# Patient Record
Sex: Male | Born: 1985 | Hispanic: No | Marital: Married | Smoking: Former smoker
Health system: Southern US, Community
[De-identification: ages and names within clinical notes are randomized; demographics above are authoritative.]

## PROBLEM LIST (undated history)

## (undated) DIAGNOSIS — Z720 Tobacco use: Secondary | ICD-10-CM

## (undated) DIAGNOSIS — F988 Other specified behavioral and emotional disorders with onset usually occurring in childhood and adolescence: Secondary | ICD-10-CM

## (undated) DIAGNOSIS — E785 Hyperlipidemia, unspecified: Secondary | ICD-10-CM

## (undated) DIAGNOSIS — G4733 Obstructive sleep apnea (adult) (pediatric): Secondary | ICD-10-CM

## (undated) HISTORY — DX: Obstructive sleep apnea (adult) (pediatric): G47.33

## (undated) HISTORY — DX: Tobacco use: Z72.0

## (undated) HISTORY — DX: Hyperlipidemia, unspecified: E78.5

## (undated) HISTORY — PX: NO PAST SURGERIES: SHX2092

## (undated) HISTORY — DX: Other specified behavioral and emotional disorders with onset usually occurring in childhood and adolescence: F98.8

---

## 2010-08-10 ENCOUNTER — Encounter: Payer: Self-pay | Admitting: *Deleted

## 2010-08-10 ENCOUNTER — Emergency Department (HOSPITAL_BASED_OUTPATIENT_CLINIC_OR_DEPARTMENT_OTHER)
Admission: EM | Admit: 2010-08-10 | Discharge: 2010-08-10 | Disposition: A | Discharge status: Home / Self Care | Payer: BC Managed Care – PPO | Attending: Emergency Medicine | Admitting: Emergency Medicine

## 2010-08-10 DIAGNOSIS — T2026XA Burn of second degree of forehead and cheek, initial encounter: Secondary | ICD-10-CM | POA: Insufficient documentation

## 2010-08-10 DIAGNOSIS — T2024XA Burn of second degree of nose (septum), initial encounter: Secondary | ICD-10-CM | POA: Insufficient documentation

## 2010-08-10 DIAGNOSIS — X020XXA Exposure to flames in controlled fire in building or structure, initial encounter: Secondary | ICD-10-CM | POA: Insufficient documentation

## 2010-08-10 DIAGNOSIS — F17200 Nicotine dependence, unspecified, uncomplicated: Secondary | ICD-10-CM | POA: Insufficient documentation

## 2010-08-10 DIAGNOSIS — T2020XA Burn of second degree of head, face, and neck, unspecified site, initial encounter: Secondary | ICD-10-CM

## 2010-08-10 MED ORDER — HYDROMORPHONE HCL 1 MG/ML IJ SOLN
INTRAMUSCULAR | Status: AC
  Administered 2010-08-10: 1 mg
  Filled 2010-08-10: qty 1

## 2010-08-10 MED ORDER — FLUORESCEIN SODIUM 1 MG OP STRP
ORAL_STRIP | OPHTHALMIC | Status: AC
  Administered 2010-08-10: 2
  Filled 2010-08-10: qty 2

## 2010-08-10 MED ORDER — PROPARACAINE HCL 0.5 % OP SOLN
OPHTHALMIC | Status: AC
  Administered 2010-08-10: 2 [drp]
  Filled 2010-08-10: qty 15

## 2010-08-10 MED ORDER — TETANUS-DIPHTH-ACELL PERTUSSIS 5-2-15.5 LF-MCG/0.5 IM SUSP
0.5000 mL | Freq: Once | INTRAMUSCULAR | Status: AC
  Administered 2010-08-10: 0.5 mL via INTRAMUSCULAR
  Filled 2010-08-10: qty 0.5

## 2010-08-10 MED ORDER — OXYCODONE-ACETAMINOPHEN 5-325 MG PO TABS: 1.0000 | ORAL_TABLET | ORAL | Status: AC | PRN
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
## 2010-10-09 ENCOUNTER — Ambulatory Visit (INDEPENDENT_AMBULATORY_CARE_PROVIDER_SITE_OTHER): Payer: BC Managed Care – PPO | Admitting: Internal Medicine

## 2010-10-09 ENCOUNTER — Encounter: Payer: Self-pay | Admitting: Internal Medicine

## 2010-10-09 DIAGNOSIS — R030 Elevated blood-pressure reading, without diagnosis of hypertension: Secondary | ICD-10-CM

## 2010-10-09 DIAGNOSIS — F988 Other specified behavioral and emotional disorders with onset usually occurring in childhood and adolescence: Secondary | ICD-10-CM

## 2010-10-09 DIAGNOSIS — F17200 Nicotine dependence, unspecified, uncomplicated: Secondary | ICD-10-CM

## 2010-10-09 DIAGNOSIS — E785 Hyperlipidemia, unspecified: Secondary | ICD-10-CM

## 2010-10-09 DIAGNOSIS — IMO0001 Reserved for inherently not codable concepts without codable children: Secondary | ICD-10-CM

## 2010-10-09 MED ORDER — AMPHETAMINE-DEXTROAMPHETAMINE 20 MG PO TABS: 20.0000 mg | ORAL_TABLET | Freq: Two times a day (BID) | ORAL | Status: DC

## 2010-10-09 NOTE — Patient Instructions (Signed)
Please schedule chem7 (v58.69), lipid/lft (272.4) prior to next visit

## 2010-10-10 DIAGNOSIS — F17200 Nicotine dependence, unspecified, uncomplicated: Secondary | ICD-10-CM | POA: Insufficient documentation

## 2010-10-10 DIAGNOSIS — IMO0001 Reserved for inherently not codable concepts without codable children: Secondary | ICD-10-CM | POA: Insufficient documentation

## 2010-10-10 DIAGNOSIS — F988 Other specified behavioral and emotional disorders with onset usually occurring in childhood and adolescence: Secondary | ICD-10-CM | POA: Insufficient documentation

## 2010-10-10 DIAGNOSIS — E785 Hyperlipidemia, unspecified: Secondary | ICD-10-CM | POA: Insufficient documentation

## 2010-10-10 NOTE — Assessment & Plan Note (Signed)
Stable. Rf adderall at current dosing. Monitor bp.

## 2010-10-10 NOTE — Assessment & Plan Note (Signed)
Actively attempting cessation. Performing wean. Not currently interested in medication. Discussed tobacco cessation including potential medications. Total time of discussion approximately 3 minutes.

## 2010-10-10 NOTE — Progress Notes (Signed)
  Subjective:    Patient ID: Joel [name], male    DOB: 1985-02-06, 25 y.o.   MRN: [id number]  HPI Pt presents to clinic to establish care and for evaluation of multiple medical problems. H/o ADD s/p formal evaluation years ago. Takes adderral with increased concentration. Currently working at multiple jobs and going to school. Finds the medication beneficial. Has experienced no side effects of nervousness, wt change or insomnia. BP is mildly elevated. Home BP typically 130's/80's. No formal h/o HTN. H/o hyperlipidemia previously taking medication but now off. Did not have side effects. Receives influenza vaccine at work. Smokes 1/2 tobacco daily. Is attempting weaning.  No other complaints.   Reviewed pmh, psh, medications, allergies, soc hx and fam hx.    Review of Systems  Constitutional: Negative for appetite change and unexpected weight change.  Respiratory: Negative for shortness of breath.   Cardiovascular: Negative for chest pain.  Psychiatric/Behavioral: Negative for sleep disturbance and agitation. The patient is not nervous/anxious.   All other systems reviewed and are negative.       Objective:   Physical Exam  Physical Exam  Nursing note and vitals reviewed. Constitutional: Appears well-developed and well-nourished. No distress.  HENT: PERRL, EOM grossly intact. OP clear without erythema or exudate. Uvula midline. Mucous membranes moist Head: Normocephalic and atraumatic.  Right Ear: External ear normal.  Left Ear: External ear normal.  Eyes: Conjunctivae are normal. No scleral icterus.  Neck: Neck supple. Carotid bruit is not present.  Cardiovascular: Normal rate, regular rhythm and normal heart sounds.  Exam reveals no gallop and no friction rub.   No murmur heard. Pulmonary/Chest: Effort normal and breath sounds normal. No respiratory distress. He has no wheezes. no rales.  Lymphadenopathy:    He has no cervical adenopathy.  Neurological:Alert.  Skin: Skin is  warm and dry. Not diaphoretic.  Psychiatric: Has a normal mood and affect.        Assessment & Plan:

## 2010-10-10 NOTE — Assessment & Plan Note (Signed)
No formal h/o htn. Recommend close observation with home bp log. If elevations persist recommend dc of adderral.

## 2010-10-10 NOTE — Assessment & Plan Note (Signed)
Off medication. Low fat diet and exercise. Obtain lipid/lft prior to next visit

## 2010-11-07 ENCOUNTER — Telehealth: Payer: Self-pay | Admitting: Internal Medicine

## 2010-11-07 MED ORDER — AMPHETAMINE-DEXTROAMPHETAMINE 20 MG PO TABS: 20.0000 mg | ORAL_TABLET | Freq: Two times a day (BID) | ORAL | Status: DC

## 2010-11-07 NOTE — Telephone Encounter (Signed)
Pt called needs a refill on his  Adderall 20mg    Please call when ready

## 2010-11-07 NOTE — Telephone Encounter (Signed)
Yes pls

## 2010-11-07 NOTE — Telephone Encounter (Signed)
Is it okay to refill for patient. He is scheduled for follow up appointment on 01/01/2011.

## 2010-11-07 NOTE — Telephone Encounter (Signed)
Call placed to patient at [phone], no answer. A detailed voice message was left informing patient Rx available for pick up.

## 2011-01-01 ENCOUNTER — Ambulatory Visit: Payer: BC Managed Care – PPO | Admitting: Internal Medicine

## 2011-01-01 ENCOUNTER — Ambulatory Visit: Payer: BC Managed Care – PPO | Admitting: Family

## 2011-01-01 DIAGNOSIS — Z0289 Encounter for other administrative examinations: Secondary | ICD-10-CM

## 2011-01-10 ENCOUNTER — Telehealth: Payer: Self-pay | Admitting: Internal Medicine

## 2011-01-10 DIAGNOSIS — F988 Other specified behavioral and emotional disorders with onset usually occurring in childhood and adolescence: Secondary | ICD-10-CM

## 2011-01-10 DIAGNOSIS — E785 Hyperlipidemia, unspecified: Secondary | ICD-10-CM

## 2011-01-10 NOTE — Telephone Encounter (Signed)
Patient states that he is coming in next week for a follow up and is unsure if he needs labs. He states that if he does have to do lab work he will go in tomorrow morning to [facility].   Also, he is out of adderrall and would like a new prescription for that.

## 2011-01-10 NOTE — Telephone Encounter (Signed)
Ok to print two prescription rf. Lipid/lft 272.4 chem7 v58.69

## 2011-01-10 NOTE — Telephone Encounter (Signed)
Dr Rodena Medin; I did not find any lab notes. Will the patient need blood work before his follow up appointment with you on 01/18/2011.

## 2011-01-11 MED ORDER — AMPHETAMINE-DEXTROAMPHETAMINE 20 MG PO TABS: 20.0000 mg | ORAL_TABLET | Freq: Two times a day (BID) | ORAL | Status: DC

## 2011-01-11 NOTE — Telephone Encounter (Signed)
Call placed to patient at [phone], he was informed of blood work and stated he will come on Friday 01/12/2011 for the blood work.  Rx's Printed signed and left at front desk for patient pick up.

## 2011-01-12 LAB — LIPID PANEL
Cholesterol: 200 mg/dL (ref 0–200)
HDL: 41 mg/dL (ref >39)
LDL Cholesterol: 127 mg/dL — ABNORMAL HIGH (ref 0–99)
Total CHOL/HDL Ratio: 4.9 Ratio
Triglycerides: 159 mg/dL — ABNORMAL HIGH (ref <150)
VLDL: 32 mg/dL (ref 0–40)

## 2011-01-12 LAB — HEPATIC FUNCTION PANEL
ALT: 104 U/L — ABNORMAL HIGH (ref 0–53)
AST: 41 U/L — ABNORMAL HIGH (ref 0–37)
Albumin: 4.6 g/dL (ref 3.5–5.2)
Alkaline Phosphatase: 82 U/L (ref 39–117)
Bilirubin, Direct: 0.2 mg/dL (ref 0.0–0.3)
Indirect Bilirubin: 0.6 mg/dL (ref 0.0–0.9)
Total Bilirubin: 0.8 mg/dL (ref 0.3–1.2)
Total Protein: 7 g/dL (ref 6.0–8.3)

## 2011-01-15 ENCOUNTER — Ambulatory Visit (HOSPITAL_BASED_OUTPATIENT_CLINIC_OR_DEPARTMENT_OTHER)
Admission: RE | Admit: 2011-01-15 | Discharge: 2011-01-15 | Disposition: A | Discharge status: Home / Self Care | Payer: BC Managed Care – PPO | Source: Ambulatory Visit | Attending: Internal Medicine | Admitting: Internal Medicine

## 2011-01-15 ENCOUNTER — Ambulatory Visit (INDEPENDENT_AMBULATORY_CARE_PROVIDER_SITE_OTHER): Payer: BC Managed Care – PPO | Admitting: Internal Medicine

## 2011-01-15 ENCOUNTER — Encounter: Payer: Self-pay | Admitting: Internal Medicine

## 2011-01-15 VITALS — BP 106/74 | HR 90 | Temp 98.1°F | Resp 18 | Wt 227.0 lb

## 2011-01-15 DIAGNOSIS — M25519 Pain in unspecified shoulder: Secondary | ICD-10-CM

## 2011-01-15 DIAGNOSIS — M25511 Pain in right shoulder: Secondary | ICD-10-CM

## 2011-01-15 MED ORDER — HYDROCODONE-ACETAMINOPHEN 5-500 MG PO TABS: 1.0000 | ORAL_TABLET | Freq: Four times a day (QID) | ORAL | Status: AC | PRN

## 2011-01-17 DIAGNOSIS — M25519 Pain in unspecified shoulder: Secondary | ICD-10-CM | POA: Insufficient documentation

## 2011-01-17 NOTE — Progress Notes (Signed)
  Subjective:    Patient ID: Joel [name], male    DOB: [birth year], 25 y.o.   MRN: [id number]  HPI Pt presents to clinic for evaluation of shoulder pain. Yesterday while pulling himself up and down into an attic noted a pop of left shoulder and intense pain. Pushed the upper arm back in however has had decreased ROM especially abduction. Describes pain level 7/10. Pain worse with movement. Attempted sling. Not taking any medication for the problem. No other complaints.  No past medical history on file. Past Surgical History  Procedure Date  . No past surgeries     denies surgical history    reports that he has been smoking.  He has never used smokeless tobacco. He reports that he uses illicit drugs (Marijuana) about 7 times per week. He reports that he does not drink alcohol. family history includes Healthy in his brother and father and Hypertension in his mother. Allergies  Allergen Reactions  . Penicillins     Mother allergic to it. Pt never taken it.     Review of Systems see hpi     Objective:   Physical Exam  Nursing note and vitals reviewed. Constitutional: He appears well-developed and well-nourished. No distress.  HENT:  Head: Normocephalic and atraumatic.  Musculoskeletal:       Left shoulder -NT. ROM limited by pain. Abduction significantly limited  Neurological: He is alert.  Skin: Skin is warm and dry. He is not diaphoretic.  Psychiatric: He has a normal mood and affect.          Assessment & Plan:

## 2011-01-17 NOTE — Assessment & Plan Note (Signed)
Shoulder xray today. Work note provided. Attempt short term vicodin prn. Cautioned re possible sedating effect. Anticipate possible ortho consult pending xray results

## 2011-01-18 ENCOUNTER — Ambulatory Visit: Payer: BC Managed Care – PPO | Admitting: Internal Medicine

## 2011-01-18 DIAGNOSIS — Z0289 Encounter for other administrative examinations: Secondary | ICD-10-CM

## 2011-02-09 ENCOUNTER — Encounter: Payer: Self-pay | Admitting: *Deleted

## 2011-03-08 ENCOUNTER — Other Ambulatory Visit: Payer: Self-pay | Admitting: *Deleted

## 2011-03-08 DIAGNOSIS — F988 Other specified behavioral and emotional disorders with onset usually occurring in childhood and adolescence: Secondary | ICD-10-CM

## 2011-03-08 MED ORDER — AMPHETAMINE-DEXTROAMPHETAMINE 20 MG PO TABS: 20.0000 mg | ORAL_TABLET | Freq: Two times a day (BID) | ORAL | Status: DC

## 2011-03-08 NOTE — Telephone Encounter (Signed)
Received call from pt requesting refill of adderall. Rx printed and forwarded to Provider for signature. Advised pt I will call him tomorrow when rx is ready for pick up.

## 2011-03-09 NOTE — Telephone Encounter (Signed)
Rx was forwarded to Receptionist to call pt for pick up at 11:30am today.

## 2011-04-11 ENCOUNTER — Telehealth: Payer: Self-pay | Admitting: Internal Medicine

## 2011-04-11 DIAGNOSIS — R945 Abnormal results of liver function studies: Secondary | ICD-10-CM

## 2011-04-11 DIAGNOSIS — R7989 Other specified abnormal findings of blood chemistry: Secondary | ICD-10-CM

## 2011-04-11 DIAGNOSIS — F988 Other specified behavioral and emotional disorders with onset usually occurring in childhood and adolescence: Secondary | ICD-10-CM

## 2011-04-11 MED ORDER — AMPHETAMINE-DEXTROAMPHETAMINE 20 MG PO TABS: 20.0000 mg | ORAL_TABLET | Freq: Two times a day (BID) | ORAL | Status: DC

## 2011-04-11 NOTE — Telephone Encounter (Signed)
NEEDS RX FOR ADDERALL 20 MG

## 2011-04-11 NOTE — Telephone Encounter (Signed)
Call returned to patient at [phone], no answer. A detailed voice message was left informing patient per Dr Rodena Medin instructions. He was advised to call to schedule follow up appointment with blood work.  Rx printed and sent to provider for signature.

## 2011-04-11 NOTE — Telephone Encounter (Signed)
Ok to print but needs f/u appt and labs. lft elevated in December. Multiple calls made and letter sent without response.

## 2011-04-12 NOTE — Telephone Encounter (Signed)
Patient presented to office, Rx for Adderall provided to patient. He was advised to schedule follow up. He has scheduled for 05/10/2011 with Dr Rodena Medin. Lab orders entered for April 2013.

## 2011-05-10 ENCOUNTER — Ambulatory Visit (INDEPENDENT_AMBULATORY_CARE_PROVIDER_SITE_OTHER): Payer: BC Managed Care – PPO | Admitting: Internal Medicine

## 2011-05-10 ENCOUNTER — Encounter: Payer: Self-pay | Admitting: Internal Medicine

## 2011-05-10 ENCOUNTER — Telehealth: Payer: Self-pay | Admitting: Internal Medicine

## 2011-05-10 VITALS — BP 114/82 | HR 92 | Temp 98.1°F | Resp 18 | Wt 233.0 lb

## 2011-05-10 DIAGNOSIS — F988 Other specified behavioral and emotional disorders with onset usually occurring in childhood and adolescence: Secondary | ICD-10-CM

## 2011-05-10 DIAGNOSIS — E785 Hyperlipidemia, unspecified: Secondary | ICD-10-CM

## 2011-05-10 DIAGNOSIS — R7989 Other specified abnormal findings of blood chemistry: Secondary | ICD-10-CM

## 2011-05-10 DIAGNOSIS — M25519 Pain in unspecified shoulder: Secondary | ICD-10-CM

## 2011-05-10 DIAGNOSIS — R945 Abnormal results of liver function studies: Secondary | ICD-10-CM | POA: Insufficient documentation

## 2011-05-10 DIAGNOSIS — Z79899 Other long term (current) drug therapy: Secondary | ICD-10-CM

## 2011-05-10 LAB — HEPATIC FUNCTION PANEL
ALT: 113 U/L — ABNORMAL HIGH (ref 0–53)
AST: 41 U/L — ABNORMAL HIGH (ref 0–37)
Albumin: 4.5 g/dL (ref 3.5–5.2)
Alkaline Phosphatase: 85 U/L (ref 39–117)
Bilirubin, Direct: 0.1 mg/dL (ref 0.0–0.3)
Indirect Bilirubin: 0.3 mg/dL (ref 0.0–0.9)
Total Bilirubin: 0.4 mg/dL (ref 0.3–1.2)
Total Protein: 6.9 g/dL (ref 6.0–8.3)

## 2011-05-10 LAB — IRON: Iron: 73 ug/dL (ref 42–165)

## 2011-05-10 MED ORDER — AMPHETAMINE-DEXTROAMPHETAMINE 20 MG PO TABS: 20.0000 mg | ORAL_TABLET | Freq: Two times a day (BID) | ORAL | Status: DC

## 2011-05-10 NOTE — Patient Instructions (Signed)
Please schedule fasting labs prior to next visit chem7-v58.69, lipid/lft 272.4 

## 2011-05-10 NOTE — Progress Notes (Signed)
  Subjective:    Patient ID: Joel [name], male    DOB: 1986/01/23, 26 y.o.   MRN: [id number]  HPI Pt presents to clinic for followup of multiple medical problems. Last visit had significant shoulder pain s/p injury and was referred to orthopedics. States pain resolved without surgical intervention. Has FROM. Reviewed previously lft with elevated ast/alt. Notes no past h/o abn lft. Denies regular tylenol or etoh use. No regular otc/herbal medication. Denies abd pain, scleral icterus, n/v.  No past medical history on file. Past Surgical History  Procedure Date  . No past surgeries     denies surgical history    reports that he has been smoking.  He has never used smokeless tobacco. He reports that he uses illicit drugs (Marijuana) about 7 times per week. He reports that he does not drink alcohol. family history includes Healthy in his brother and father and Hypertension in his mother. Allergies  Allergen Reactions  . Penicillins     Mother allergic to it. Pt never taken it.      Review of Systems see hpi     Objective:   Physical Exam  Nursing note and vitals reviewed. Constitutional: He appears well-developed and well-nourished. No distress.  HENT:  Head: Normocephalic and atraumatic.  Eyes: Conjunctivae are normal. No scleral icterus.  Neurological: He is alert.  Skin: He is not diaphoretic.  Psychiatric: He has a normal mood and affect.          Assessment & Plan:

## 2011-05-10 NOTE — Assessment & Plan Note (Signed)
Stable. rf medication

## 2011-05-10 NOTE — Telephone Encounter (Signed)
Please schedule fasting labs prior to next visit  chem7-v58.69, lipid/lft-272.4   Patient has upcoming appointment on 08/10/11. Patient will be going to [facility] lab.

## 2011-05-10 NOTE — Assessment & Plan Note (Signed)
resolved 

## 2011-05-10 NOTE — Assessment & Plan Note (Signed)
Repeat lft. Obtain hepatitis panel and ferritin level. Consider hepatic imaging if lft's remain elevated.

## 2011-05-11 LAB — HEPATITIS PANEL, ACUTE
HCV Ab: NEGATIVE
Hep A IgM: NEGATIVE
Hep B C IgM: NEGATIVE
Hepatitis B Surface Ag: NEGATIVE

## 2011-05-11 NOTE — Telephone Encounter (Signed)
Lab orders entered for July 2013. 

## 2011-06-11 ENCOUNTER — Telehealth: Payer: Self-pay | Admitting: Internal Medicine

## 2011-06-11 DIAGNOSIS — F988 Other specified behavioral and emotional disorders with onset usually occurring in childhood and adolescence: Secondary | ICD-10-CM

## 2011-06-12 MED ORDER — AMPHETAMINE-DEXTROAMPHETAMINE 20 MG PO TABS: 20.0000 mg | ORAL_TABLET | Freq: Two times a day (BID) | ORAL | Status: DC

## 2011-06-12 NOTE — Telephone Encounter (Signed)
Call placed to patient at [phone], he was informed per Dr Rodena Medin approval, Rx re-printed and left at front desk for patient pick up.

## 2011-06-13 NOTE — Telephone Encounter (Signed)
Per Marj at front desk, patient has picked up Rx today at lunch time.

## 2011-07-12 ENCOUNTER — Telehealth: Payer: Self-pay | Admitting: Internal Medicine

## 2011-07-12 DIAGNOSIS — F988 Other specified behavioral and emotional disorders with onset usually occurring in childhood and adolescence: Secondary | ICD-10-CM

## 2011-07-12 NOTE — Telephone Encounter (Signed)
Ok. But never did abd [place] for abn lft

## 2011-07-12 NOTE — Telephone Encounter (Signed)
Patient is requesting a refill of adderall.

## 2011-07-13 ENCOUNTER — Telehealth: Payer: Self-pay | Admitting: Internal Medicine

## 2011-07-13 MED ORDER — AMPHETAMINE-DEXTROAMPHETAMINE 20 MG PO TABS: 20.0000 mg | ORAL_TABLET | Freq: Two times a day (BID) | ORAL | Status: DC

## 2011-07-13 NOTE — Telephone Encounter (Signed)
Patient picked up adderall prescription.

## 2011-07-13 NOTE — Telephone Encounter (Signed)
Rx printed and forwarded to provider for signature.   

## 2011-07-13 NOTE — Telephone Encounter (Signed)
noted 

## 2011-07-13 NOTE — Telephone Encounter (Signed)
Call placed to patient at [phone], he was informed Rx would be left at front desk for pick up.

## 2011-07-13 NOTE — Telephone Encounter (Signed)
Noted patient pick up in 07/13/2011 phone note.

## 2011-08-09 ENCOUNTER — Telehealth: Payer: Self-pay | Admitting: Internal Medicine

## 2011-08-09 DIAGNOSIS — F988 Other specified behavioral and emotional disorders with onset usually occurring in childhood and adolescence: Secondary | ICD-10-CM

## 2011-08-09 MED ORDER — AMPHETAMINE-DEXTROAMPHETAMINE 20 MG PO TABS: 20.0000 mg | ORAL_TABLET | Freq: Two times a day (BID) | ORAL | Status: DC

## 2011-08-09 NOTE — Telephone Encounter (Signed)
Patient had a 3 month follow up visit with Dr. Rodena Medin tomorrow afternoon but he forgot to do labs prior. He rescheduled for 08/21/11 and will go do labs Monday or Tuesday of next week.   He would like a new prescription of adderall.

## 2011-08-09 NOTE — Telephone Encounter (Signed)
ok 

## 2011-08-09 NOTE — Telephone Encounter (Signed)
Last rx printed fo4 07/13/11. Pt has f/u on 08/21/11. Rx printed and forwarded to Provider for signature.

## 2011-08-10 ENCOUNTER — Ambulatory Visit: Payer: BC Managed Care – PPO | Admitting: Internal Medicine

## 2011-08-10 ENCOUNTER — Telehealth: Payer: Self-pay | Admitting: *Deleted

## 2011-08-10 NOTE — Telephone Encounter (Signed)
See phone note of 08/10/11 re: message.

## 2011-08-10 NOTE — Telephone Encounter (Signed)
Best # to reach patient at is [phone]. To let him know that rx is ready to be picked up.

## 2011-08-10 NOTE — Telephone Encounter (Signed)
Left message on VM of Rx ready to pick up at front desk

## 2011-08-16 LAB — BASIC METABOLIC PANEL
BUN: 10 mg/dL (ref 6–23)
CO2: 25 mEq/L (ref 19–32)
Calcium: 8.9 mg/dL (ref 8.4–10.5)
Chloride: 108 mEq/L (ref 96–112)
Creat: 0.92 mg/dL (ref 0.50–1.35)
Glucose, Bld: 89 mg/dL (ref 70–99)
Potassium: 4.3 mEq/L (ref 3.5–5.3)
Sodium: 138 mEq/L (ref 135–145)

## 2011-08-16 NOTE — Addendum Note (Signed)
Addended by: Mervin Kung A on: 08/16/2011 09:15 AM   Modules accepted: Orders

## 2011-08-16 NOTE — Telephone Encounter (Signed)
Pt presented to the lab, orders released. 

## 2011-08-17 LAB — HEPATIC FUNCTION PANEL
ALT: 98 U/L — ABNORMAL HIGH (ref 0–53)
AST: 40 U/L — ABNORMAL HIGH (ref 0–37)
Albumin: 4.1 g/dL (ref 3.5–5.2)
Alkaline Phosphatase: 83 U/L (ref 39–117)
Bilirubin, Direct: 0.1 mg/dL (ref 0.0–0.3)
Indirect Bilirubin: 0.3 mg/dL (ref 0.0–0.9)
Total Bilirubin: 0.4 mg/dL (ref 0.3–1.2)
Total Protein: 6.2 g/dL (ref 6.0–8.3)

## 2011-08-17 LAB — LIPID PANEL
Cholesterol: 166 mg/dL (ref 0–200)
HDL: 37 mg/dL — ABNORMAL LOW (ref >39)
LDL Cholesterol: 92 mg/dL (ref 0–99)
Total CHOL/HDL Ratio: 4.5 Ratio
Triglycerides: 185 mg/dL — ABNORMAL HIGH (ref <150)
VLDL: 37 mg/dL (ref 0–40)

## 2011-08-21 ENCOUNTER — Encounter: Payer: Self-pay | Admitting: Internal Medicine

## 2011-08-21 ENCOUNTER — Ambulatory Visit (INDEPENDENT_AMBULATORY_CARE_PROVIDER_SITE_OTHER): Payer: BC Managed Care – PPO | Admitting: Internal Medicine

## 2011-08-21 VITALS — BP 118/78 | HR 93 | Temp 98.3°F | Resp 16 | Wt 230.8 lb

## 2011-08-21 DIAGNOSIS — R7989 Other specified abnormal findings of blood chemistry: Secondary | ICD-10-CM

## 2011-08-21 DIAGNOSIS — R945 Abnormal results of liver function studies: Secondary | ICD-10-CM

## 2011-08-21 DIAGNOSIS — F988 Other specified behavioral and emotional disorders with onset usually occurring in childhood and adolescence: Secondary | ICD-10-CM

## 2011-08-21 DIAGNOSIS — R748 Abnormal levels of other serum enzymes: Secondary | ICD-10-CM

## 2011-08-21 NOTE — Patient Instructions (Signed)
Please schedule your ultrasound for a morning this week if possible.

## 2011-08-23 ENCOUNTER — Ambulatory Visit (HOSPITAL_BASED_OUTPATIENT_CLINIC_OR_DEPARTMENT_OTHER)
Admission: RE | Admit: 2011-08-23 | Discharge: 2011-08-23 | Disposition: A | Discharge status: Home / Self Care | Payer: BC Managed Care – PPO | Source: Ambulatory Visit | Attending: Internal Medicine | Admitting: Internal Medicine

## 2011-08-23 DIAGNOSIS — R748 Abnormal levels of other serum enzymes: Secondary | ICD-10-CM | POA: Insufficient documentation

## 2011-08-23 DIAGNOSIS — K7689 Other specified diseases of liver: Secondary | ICD-10-CM | POA: Insufficient documentation

## 2011-09-02 NOTE — Assessment & Plan Note (Signed)
Schedule abdominal US for further evaluation.

## 2011-09-02 NOTE — Assessment & Plan Note (Signed)
Stable; continue current regimen.

## 2011-09-02 NOTE — Progress Notes (Signed)
  Subjective:    Patient ID: Joel [name], male    DOB: 1985-12-17, 26 y.o.   MRN: [id number]  HPI Pt presents to clinic for followup of multiple medical problems.  Reviewed elevated lft with neg hep panel and nl iron level. Recent lft mildly improved. Denies etoh or tylenol use.ADD stable with no side effects from medication.   No past medical history on file. Past Surgical History  Procedure Date  . No past surgeries     denies surgical history    reports that he has been smoking.  He has never used smokeless tobacco. He reports that he uses illicit drugs (Marijuana) about 7 times per week. He reports that he does not drink alcohol. family history includes Healthy in his brother and father and Hypertension in his mother. Allergies  Allergen Reactions  . Penicillins     Mother allergic to it. Pt never taken it.      Review of Systems  Gastrointestinal: Negative for nausea and abdominal pain.   see hpi     Objective:   Physical Exam  Constitutional: He appears well-developed and well-nourished. No distress.  Eyes: Conjunctivae are normal. No scleral icterus.  Neck: Neck supple.  Neurological: He is alert.  Skin: He is not diaphoretic.  Psychiatric: He has a normal mood and affect.          Assessment & Plan:

## 2011-09-10 ENCOUNTER — Other Ambulatory Visit: Payer: Self-pay | Admitting: Internal Medicine

## 2011-09-10 ENCOUNTER — Telehealth: Payer: Self-pay | Admitting: Internal Medicine

## 2011-09-10 DIAGNOSIS — F988 Other specified behavioral and emotional disorders with onset usually occurring in childhood and adolescence: Secondary | ICD-10-CM

## 2011-09-10 MED ORDER — AMPHETAMINE-DEXTROAMPHETAMINE 20 MG PO TABS: 20.0000 mg | ORAL_TABLET | Freq: Two times a day (BID) | ORAL | Status: DC

## 2011-09-10 NOTE — Telephone Encounter (Signed)
printed

## 2011-09-10 NOTE — Telephone Encounter (Signed)
Patient need his prescription written for Adderall 20mg    Would like to pick up tomorrow

## 2011-09-10 NOTE — Telephone Encounter (Signed)
Informed patient Rx can be p/u tomorrow 08.13.13 between the hours of 8am-5pm [Mon-Fri]/SLS

## 2011-10-12 ENCOUNTER — Other Ambulatory Visit: Payer: Self-pay | Admitting: *Deleted

## 2011-10-12 DIAGNOSIS — F988 Other specified behavioral and emotional disorders with onset usually occurring in childhood and adolescence: Secondary | ICD-10-CM

## 2011-10-12 MED ORDER — AMPHETAMINE-DEXTROAMPHETAMINE 20 MG PO TABS: 20.0000 mg | ORAL_TABLET | Freq: Two times a day (BID) | ORAL | Status: DC

## 2011-10-12 NOTE — Telephone Encounter (Signed)
Ok to print

## 2011-10-12 NOTE — Telephone Encounter (Signed)
Adderall request [Last Rx 08.12.13 #[id number]]/SLS Please advise.

## 2011-10-15 NOTE — Telephone Encounter (Signed)
LMOM with contact name & number to Inform patient Rx requested is ready forp/u Mon-Fri 8a-5p

## 2011-10-29 ENCOUNTER — Ambulatory Visit (INDEPENDENT_AMBULATORY_CARE_PROVIDER_SITE_OTHER): Payer: BC Managed Care – PPO | Admitting: Family

## 2011-10-29 ENCOUNTER — Encounter: Payer: Self-pay | Admitting: Family

## 2011-10-29 VITALS — BP 118/86 | HR 84 | Temp 98.4°F | Resp 16 | Wt 231.1 lb

## 2011-10-29 DIAGNOSIS — M79609 Pain in unspecified limb: Secondary | ICD-10-CM

## 2011-10-29 DIAGNOSIS — S99922A Unspecified injury of left foot, initial encounter: Secondary | ICD-10-CM

## 2011-10-29 DIAGNOSIS — S8990XA Unspecified injury of unspecified lower leg, initial encounter: Secondary | ICD-10-CM

## 2011-10-29 DIAGNOSIS — M79672 Pain in left foot: Secondary | ICD-10-CM

## 2011-10-29 MED ORDER — MELOXICAM 15 MG PO TABS: 15.0000 mg | ORAL_TABLET | Freq: Every day | ORAL | Status: DC

## 2011-10-29 NOTE — Progress Notes (Signed)
  Subjective:    Patient ID: Joel [name], male    DOB: [birth year], 26 y.o.   MRN: [id number]  HPI  Mr. Carras is a 26 yr old male who presents today with chief complaint of left leg pain.  Pt reports pain started yesterday following a soccer injury. Jumped into a soccer game without warming up.  Started to run full force and felt a snap at the back of the left foot.  He reports associated cramping in the left posterior leg.  Also has pain and swelling.  Had trouble sleeping last night due to pain.  Trouble walking on the left leg.   Review of Systems    see HPI  No past medical history on file.  History   Social History  . Marital Status: Married    Spouse Name: N/A    Number of Children: N/A  . Years of Education: N/A   Occupational History  . Not on file.   Social History Main Topics  . Smoking status: Current Every Day Smoker -- 1.0 packs/day  . Smokeless tobacco: Never Used   Comment: 1/2 pack  per day  . Alcohol Use: No  . Drug Use: 7 per week    Special: Marijuana  . Sexually Active: Yes   Other Topics Concern  . Not on file   Social History Narrative  . No narrative on file    Past Surgical History  Procedure Date  . No past surgeries     denies surgical history    Family History  Problem Relation Age of Onset  . Hypertension Mother   . Healthy Father   . Healthy Brother     Allergies  Allergen Reactions  . Penicillins     Mother allergic to it. Pt never taken it.    Current Outpatient Prescriptions on File Prior to Visit  Medication Sig Dispense Refill  . amphetamine-dextroamphetamine (ADDERALL) 20 MG tablet Take 1 tablet (20 mg total) by mouth 2 (two) times daily.  60 tablet  0    BP 118/86  Pulse 84  Temp 98.4 F (36.9 C) (Oral)  Resp 16  Wt 231 lb 1.3 oz (104.817 kg)  SpO2 97%    Objective:   Physical Exam  Constitutional: He appears well-developed and well-nourished. No distress.  Musculoskeletal:       Left foot: He  exhibits decreased range of motion, tenderness and swelling.       + tenderness to palpation overlying left achilles tendon.           Assessment & Plan:

## 2011-10-29 NOTE — Patient Instructions (Addendum)
You are scheduled to meet with Dr. Pearletha Forge- sports medicine tomorrow at 10 AM- they are located on the 2nd floor. You may ice the area 3 time day as needed for pain/swelling.

## 2011-10-29 NOTE — Assessment & Plan Note (Signed)
?   Achilles tendon rupture?  Will refer to sports medicine.  Plan NSAIDS, ice, elevation, non-weight bearing.  Discussed with Dr. Pearletha Forge- he will see him in office tomorrow AM for evaluation.  Crutches provided to pt today at the courtesy of  Sports medicine.

## 2011-10-30 ENCOUNTER — Encounter: Payer: Self-pay | Admitting: Family Medicine

## 2011-10-30 ENCOUNTER — Ambulatory Visit (INDEPENDENT_AMBULATORY_CARE_PROVIDER_SITE_OTHER): Payer: BC Managed Care – PPO | Admitting: Family Medicine

## 2011-10-30 VITALS — BP 119/77 | HR 81 | Ht 72.0 in | Wt 230.0 lb

## 2011-10-30 DIAGNOSIS — S8990XA Unspecified injury of unspecified lower leg, initial encounter: Secondary | ICD-10-CM

## 2011-10-30 DIAGNOSIS — S99922A Unspecified injury of left foot, initial encounter: Secondary | ICD-10-CM

## 2011-10-30 NOTE — Patient Instructions (Addendum)
You have strained your left achilles (meaning a small partial tear). Given the large portion of your achilles is intact this should heal well with conservative care. Wear heel lifts when up and walking around. Crutches as needed for first 1-2 weeks. Ice area 15 minutes at a time 3-4 times a day. Elevate above the level of your heart when possible. Out of work until Monday then can return without instructions. Aleve OR ibuprofen as needed for pain and inflammation. Norco as needed for severe pain - no driving on this medication. Follow up with me in 2 weeks for reevaluation.

## 2011-10-31 ENCOUNTER — Encounter: Payer: Self-pay | Admitting: Family Medicine

## 2011-10-31 NOTE — Progress Notes (Signed)
  Subjective:    Patient ID: Joel [name], male    DOB: 1985-10-07, 26 y.o.   MRN: [id number]  PCP: Dr. Rodena Medin  HPI 26 yo M here for left foot/ankle injury.  Patient reports on 9/29 in soccer game he was starting to run when he felt a pop in achilles tendon/calf muscle. Came out of game. Has been icing, elevating since that time. Not taking any medication. Difficult to walk - has been limping since then. No prior injury to left ankle.  History reviewed. No pertinent past medical history.  Current Outpatient Prescriptions on File Prior to Visit  Medication Sig Dispense Refill  . amphetamine-dextroamphetamine (ADDERALL) 20 MG tablet Take 1 tablet (20 mg total) by mouth 2 (two) times daily.  60 tablet  0  . meloxicam (MOBIC) 15 MG tablet Take 1 tablet (15 mg total) by mouth daily.  10 tablet  0    Past Surgical History  Procedure Date  . No past surgeries     denies surgical history    Allergies  Allergen Reactions  . Penicillins     Mother allergic to it. Pt never taken it.    History   Social History  . Marital Status: Married    Spouse Name: N/A    Number of Children: N/A  . Years of Education: N/A   Occupational History  . Not on file.   Social History Main Topics  . Smoking status: Current Every Day Smoker -- 1.0 packs/day  . Smokeless tobacco: Never Used   Comment: 1/2 pack  per day  . Alcohol Use: No  . Drug Use: 7 per week    Special: Marijuana  . Sexually Active: Yes   Other Topics Concern  . Not on file   Social History Narrative  . No narrative on file    Family History  Problem Relation Age of Onset  . Hypertension Mother   . Healthy Father   . Healthy Brother   . Sudden death Neg Hx   . Hyperlipidemia Neg Hx   . Heart attack Neg Hx   . Diabetes Neg Hx     BP 119/77  Pulse 81  Ht 6' (1.829 m)  Wt 230 lb (104.327 kg)  BMI 31.19 kg/m2  Review of Systems See HPI above.    Objective:   Physical Exam Gen: NAD  L  ankle/foot: No gross deformity, swelling, ecchymoses FROM ankle but pain with calf raise actively - cannot do fully due to pain. TTP musculotendinous junction of achilles, some within achilles about 4 cm prox to insertion as well.  No other foot/ankle TTP. Negative ant drawer and talar tilt.   Negative syndesmotic compression. Thompsons test negative. NV intact distally.  MSK u/s L ankle:  Achilles tendon appears intact distally.  At musculotendinous junction by trans view appears to be about a 10-15% cross sectional tear medially of achilles tendon but most of tendon intact.  Some fluid in this area and neovascularity.  No other abnormalities of achilles tendon.    Assessment & Plan:  1. Left achilles tendon strain/partial tear - small about 10-15% tear visualized by ultrasound and musculotendinous junction.  Should do well with conservative care.  Start with rest, icing, heel lifts, tylenol/nsaids as needed.  Norco q6hprn severe pain prescribed as needed.  Out of work until Monday.  Crutches for first 1-2 weeks.  F/u in 2 weeks - plan to reevaluate and advance rehab, consider formal PT.

## 2011-10-31 NOTE — Assessment & Plan Note (Signed)
Left achilles tendon strain/partial tear - small about 10-15% tear visualized by ultrasound and musculotendinous junction.  Should do well with conservative care.  Start with rest, icing, heel lifts, tylenol/nsaids as needed.  Norco q6hprn severe pain prescribed as needed.  Out of work until Monday.  Crutches for first 1-2 weeks.  F/u in 2 weeks - plan to reevaluate and advance rehab, consider formal PT.

## 2011-11-08 ENCOUNTER — Telehealth: Payer: Self-pay | Admitting: Internal Medicine

## 2011-11-08 DIAGNOSIS — F988 Other specified behavioral and emotional disorders with onset usually occurring in childhood and adolescence: Secondary | ICD-10-CM

## 2011-11-08 MED ORDER — AMPHETAMINE-DEXTROAMPHETAMINE 20 MG PO TABS: 20.0000 mg | ORAL_TABLET | Freq: Two times a day (BID) | ORAL | Status: DC

## 2011-11-08 NOTE — Telephone Encounter (Signed)
Patient is requesting a refill on adderall. He would like to pickup prescription sometime tomorrow. I explained to him that some one from our office will call him when prescription is ready for pickup.

## 2011-11-08 NOTE — Telephone Encounter (Signed)
Rx signed, copy left at front desk and detailed message left on home #.

## 2011-11-08 NOTE — Telephone Encounter (Signed)
Last rx printed 10/12/11. Current rx printed and forwarded to Provider for signature.

## 2011-11-13 ENCOUNTER — Ambulatory Visit (INDEPENDENT_AMBULATORY_CARE_PROVIDER_SITE_OTHER): Payer: BC Managed Care – PPO | Admitting: Family Medicine

## 2011-11-13 ENCOUNTER — Encounter: Payer: Self-pay | Admitting: Family Medicine

## 2011-11-13 VITALS — BP 117/78 | HR 86 | Ht 72.0 in | Wt 230.0 lb

## 2011-11-13 DIAGNOSIS — S99929A Unspecified injury of unspecified foot, initial encounter: Secondary | ICD-10-CM

## 2011-11-13 DIAGNOSIS — S99922A Unspecified injury of left foot, initial encounter: Secondary | ICD-10-CM

## 2011-11-13 DIAGNOSIS — S8990XA Unspecified injury of unspecified lower leg, initial encounter: Secondary | ICD-10-CM

## 2011-11-14 ENCOUNTER — Encounter: Payer: Self-pay | Admitting: Family Medicine

## 2011-11-14 NOTE — Assessment & Plan Note (Signed)
Left achilles tendon strain/partial tear - Significant improvement over 2 weeks.  Will start strengthening exercises which were demonstrated today.  Advised for him to wait 4 weeks before returning to soccer though.  Continue heel lifts for next 4 weeks also.  Icing, elevation as needed.  F/u prn.

## 2011-11-14 NOTE — Progress Notes (Signed)
  Subjective:    Patient ID: Joel [name], male    DOB: 1985/06/24, 26 y.o.   MRN: [id number]  PCP: Dr. Rodena Medin  HPI  26 yo M here for f/u left foot/ankle injury.  10/1: Patient reports on 9/29 in soccer game he was starting to run when he felt a pop in achilles tendon/calf muscle. Came out of game. Has been icing, elevating since that time. Not taking any medication. Difficult to walk - has been limping since then. No prior injury to left ankle.  10/15: Patient states he is significantly better than last visit. Over 80% improved. Walking without a limp. Using heel lifts. No longer using crutches. Not taking any medicine for pain. Back at work without restrictions. When on feet for a long period of time does get some soreness.  History reviewed. No pertinent past medical history.  Current Outpatient Prescriptions on File Prior to Visit  Medication Sig Dispense Refill  . amphetamine-dextroamphetamine (ADDERALL) 20 MG tablet Take 1 tablet (20 mg total) by mouth 2 (two) times daily.  60 tablet  0  . meloxicam (MOBIC) 15 MG tablet Take 1 tablet (15 mg total) by mouth daily.  10 tablet  0    Past Surgical History  Procedure Date  . No past surgeries     denies surgical history    Allergies  Allergen Reactions  . Penicillins     Mother allergic to it. Pt never taken it.    History   Social History  . Marital Status: Married    Spouse Name: N/A    Number of Children: N/A  . Years of Education: N/A   Occupational History  . Not on file.   Social History Main Topics  . Smoking status: Current Every Day Smoker -- 0.5 packs/day  . Smokeless tobacco: Never Used   Comment: 1/2 pack  per day  . Alcohol Use: No  . Drug Use: 7 per week    Special: Marijuana  . Sexually Active: Yes   Other Topics Concern  . Not on file   Social History Narrative  . No narrative on file    Family History  Problem Relation Age of Onset  . Hypertension Mother   . Healthy  Father   . Healthy Brother   . Sudden death Neg Hx   . Hyperlipidemia Neg Hx   . Heart attack Neg Hx   . Diabetes Neg Hx     BP 117/78  Pulse 86  Ht 6' (1.829 m)  Wt 230 lb (104.327 kg)  BMI 31.19 kg/m2  Review of Systems  See HPI above.    Objective:   Physical Exam  Gen: NAD  L ankle/foot: No gross deformity, swelling, ecchymoses FROM ankle and now able to do calf raise with minimal pain. No longer with TTP musculotendinous junction of achilles.  No other foot/ankle TTP. Negative ant drawer and talar tilt.   Negative syndesmotic compression. Thompsons test negative. NV intact distally.     Assessment & Plan:  1. Left achilles tendon strain/partial tear - Significant improvement over 2 weeks.  Will start strengthening exercises which were demonstrated today.  Advised for him to wait 4 weeks before returning to soccer though.  Continue heel lifts for next 4 weeks also.  Icing, elevation as needed.  F/u prn.

## 2011-11-22 ENCOUNTER — Ambulatory Visit: Payer: BC Managed Care – PPO | Admitting: Internal Medicine

## 2011-12-12 ENCOUNTER — Other Ambulatory Visit: Payer: Self-pay | Admitting: *Deleted

## 2011-12-12 DIAGNOSIS — F988 Other specified behavioral and emotional disorders with onset usually occurring in childhood and adolescence: Secondary | ICD-10-CM

## 2011-12-12 NOTE — Telephone Encounter (Signed)
Ok to fill 

## 2011-12-12 NOTE — Telephone Encounter (Signed)
Adderall request [Last Rx 10.10.13 #60x0-not to be filled before 10.13.13]/SLS Please advise.

## 2011-12-13 MED ORDER — AMPHETAMINE-DEXTROAMPHETAMINE 20 MG PO TABS: 20.0000 mg | ORAL_TABLET | Freq: Two times a day (BID) | ORAL | Status: DC

## 2011-12-13 NOTE — Telephone Encounter (Signed)
Rx done; pt informed/SLS 

## 2012-01-10 ENCOUNTER — Other Ambulatory Visit: Payer: Self-pay | Admitting: *Deleted

## 2012-01-10 DIAGNOSIS — F988 Other specified behavioral and emotional disorders with onset usually occurring in childhood and adolescence: Secondary | ICD-10-CM

## 2012-01-10 MED ORDER — AMPHETAMINE-DEXTROAMPHETAMINE 20 MG PO TABS: 20.0000 mg | ORAL_TABLET | Freq: Two times a day (BID) | ORAL | Status: DC

## 2012-01-10 NOTE — Telephone Encounter (Signed)
Pt called requesting refill of Adderall 20mg . Rx last printed on 12/12/11. Printed rx for #60 x no refills and forwarded to Provider for signature.

## 2012-01-10 NOTE — Telephone Encounter (Signed)
Ok to fill 

## 2012-01-10 NOTE — Telephone Encounter (Signed)
Rx placed at front desk for pick up and pt notified. 

## 2012-02-11 ENCOUNTER — Telehealth: Payer: Self-pay | Admitting: Internal Medicine

## 2012-02-11 DIAGNOSIS — F988 Other specified behavioral and emotional disorders with onset usually occurring in childhood and adolescence: Secondary | ICD-10-CM

## 2012-02-11 MED ORDER — AMPHETAMINE-DEXTROAMPHETAMINE 20 MG PO TABS: 20.0000 mg | ORAL_TABLET | Freq: Two times a day (BID) | ORAL | Status: DC

## 2012-02-11 NOTE — Telephone Encounter (Signed)
Patient is requesting a new prescription for adderall °

## 2012-02-11 NOTE — Telephone Encounter (Signed)
Ok one month

## 2012-02-11 NOTE — Telephone Encounter (Signed)
Rx ready for p/u; pt informed, needs f/u OV/SLS

## 2012-02-11 NOTE — Telephone Encounter (Signed)
Adderall 20 mg BID [last Rx 12.12.13 #60x0]--Last OV 07.23.13, canceled 3-mth f/u on 10.24.13/SLS Please advise.

## 2012-03-13 ENCOUNTER — Ambulatory Visit: Payer: BC Managed Care – PPO | Admitting: Family

## 2012-03-14 ENCOUNTER — Ambulatory Visit: Payer: BC Managed Care – PPO | Admitting: Family

## 2012-03-18 ENCOUNTER — Ambulatory Visit (INDEPENDENT_AMBULATORY_CARE_PROVIDER_SITE_OTHER): Payer: BC Managed Care – PPO | Admitting: Family

## 2012-03-18 ENCOUNTER — Encounter: Payer: Self-pay | Admitting: Family

## 2012-03-18 VITALS — BP 122/82 | HR 73 | Temp 98.3°F | Resp 16 | Wt 237.0 lb

## 2012-03-18 DIAGNOSIS — F988 Other specified behavioral and emotional disorders with onset usually occurring in childhood and adolescence: Secondary | ICD-10-CM

## 2012-03-18 MED ORDER — AMPHETAMINE-DEXTROAMPHETAMINE 20 MG PO TABS: 20.0000 mg | ORAL_TABLET | Freq: Two times a day (BID) | ORAL | Status: DC

## 2012-03-18 NOTE — Patient Instructions (Addendum)
Please follow up in 6 months, sooner if problems/concerns.  

## 2012-03-18 NOTE — Progress Notes (Signed)
  Subjective:    Patient ID: Joel [name], male    DOB: February 03, 1985, 27 y.o.   MRN: [id number]  HPI Pt presents today for follow up of adhd. He continues adderall and notes that he is well controlled on this dose.  Uses once a day.  Will be starting school next semester.  Reports that the medication remains effective throughout the day.    Review of Systems     Objective:   Physical Exam  Constitutional: He is oriented to person, place, and time. He appears well-developed and well-nourished. No distress.  Cardiovascular: Normal rate and regular rhythm.   No murmur heard. Pulmonary/Chest: Effort normal and breath sounds normal. No respiratory distress. He has no wheezes. He has no rales. He exhibits no tenderness.  Neurological: He is alert and oriented to person, place, and time.  Psychiatric: He has a normal mood and affect. His behavior is normal. Judgment and thought content normal.          Assessment & Plan:

## 2012-03-18 NOTE — Assessment & Plan Note (Signed)
Stable on current dose adderall. Cont same. Refill provided.

## 2012-03-19 ENCOUNTER — Telehealth: Payer: Self-pay | Admitting: Internal Medicine

## 2012-03-19 ENCOUNTER — Ambulatory Visit: Payer: BC Managed Care – PPO | Admitting: Family

## 2012-03-19 NOTE — Telephone Encounter (Signed)
Letter completed and placed at front desk for pick up. Detailed message left on home# and to call if any questions.

## 2012-03-19 NOTE — Telephone Encounter (Signed)
Patient called in stating that he needs a work excuse note for yesterdays visit.

## 2012-04-09 ENCOUNTER — Telehealth: Payer: Self-pay | Admitting: Internal Medicine

## 2012-04-09 DIAGNOSIS — F988 Other specified behavioral and emotional disorders with onset usually occurring in childhood and adolescence: Secondary | ICD-10-CM

## 2012-04-09 MED ORDER — AMPHETAMINE-DEXTROAMPHETAMINE 20 MG PO TABS: 20.0000 mg | ORAL_TABLET | Freq: Two times a day (BID) | ORAL | Status: DC

## 2012-04-09 NOTE — Telephone Encounter (Signed)
Rx has been printed, but cannot be filled prior to 3/19.

## 2012-04-09 NOTE — Telephone Encounter (Signed)
Patient is requesting a new prescription of adderall. He hopes to pick up the prescription tomorrow.

## 2012-04-09 NOTE — Telephone Encounter (Signed)
Last Rx printed 03/18/12. Please advise if ok to give written Rx?

## 2012-04-10 NOTE — Telephone Encounter (Signed)
Notified pt and rx placed at front desk for pick up.

## 2012-05-15 ENCOUNTER — Telehealth: Payer: Self-pay | Admitting: Internal Medicine

## 2012-05-15 DIAGNOSIS — F988 Other specified behavioral and emotional disorders with onset usually occurring in childhood and adolescence: Secondary | ICD-10-CM

## 2012-05-15 MED ORDER — AMPHETAMINE-DEXTROAMPHETAMINE 20 MG PO TABS: 20.0000 mg | ORAL_TABLET | Freq: Two times a day (BID) | ORAL | Status: DC

## 2012-05-15 NOTE — Telephone Encounter (Signed)
Patient is requesting a new prescription of adderall °

## 2012-05-15 NOTE — Telephone Encounter (Signed)
Notified pt it will be Monday, 05/19/12 before Rx will be ready and he voices understanding. Rx printed and forwarded to Provider for signature.

## 2012-05-19 NOTE — Telephone Encounter (Signed)
Notified pt rx is ready for pick up at the front desk

## 2012-06-18 ENCOUNTER — Telehealth: Payer: Self-pay | Admitting: Internal Medicine

## 2012-06-18 DIAGNOSIS — F988 Other specified behavioral and emotional disorders with onset usually occurring in childhood and adolescence: Secondary | ICD-10-CM

## 2012-06-18 MED ORDER — AMPHETAMINE-DEXTROAMPHETAMINE 20 MG PO TABS: 20.0000 mg | ORAL_TABLET | Freq: Two times a day (BID) | ORAL | Status: DC

## 2012-06-18 NOTE — Telephone Encounter (Signed)
Patient is requesting a new prescription of adderall °

## 2012-06-18 NOTE — Telephone Encounter (Signed)
See rx. 

## 2012-06-18 NOTE — Telephone Encounter (Signed)
Refill request for Adderall Last filled by MD on - 05/15/2012 #60 x0 Last seen-03/18/12 F/U- none Please advise refill?

## 2012-06-18 NOTE — Telephone Encounter (Signed)
Rx placed at front desk, notified pt.

## 2012-07-16 ENCOUNTER — Telehealth: Payer: Self-pay | Admitting: Internal Medicine

## 2012-07-16 DIAGNOSIS — F988 Other specified behavioral and emotional disorders with onset usually occurring in childhood and adolescence: Secondary | ICD-10-CM

## 2012-07-16 MED ORDER — AMPHETAMINE-DEXTROAMPHETAMINE 20 MG PO TABS: 20.0000 mg | ORAL_TABLET | Freq: Two times a day (BID) | ORAL | Status: DC

## 2012-07-16 NOTE — Telephone Encounter (Signed)
Called pt no answer LMOM rx ready for pick-up.../lmb 

## 2012-07-16 NOTE — Telephone Encounter (Signed)
Rx printed- cannot be filled until 5/20.

## 2012-07-16 NOTE — Telephone Encounter (Signed)
Patient is requesting a new prescription for adderall °

## 2012-08-15 ENCOUNTER — Telehealth: Payer: Self-pay | Admitting: Internal Medicine

## 2012-08-15 DIAGNOSIS — F988 Other specified behavioral and emotional disorders with onset usually occurring in childhood and adolescence: Secondary | ICD-10-CM

## 2012-08-15 MED ORDER — AMPHETAMINE-DEXTROAMPHETAMINE 20 MG PO TABS: 20.0000 mg | ORAL_TABLET | Freq: Two times a day (BID) | ORAL | Status: DC

## 2012-08-15 NOTE — Telephone Encounter (Signed)
ADDERALL 20 MG  HE IS OUT COULD HE PLEASE HAVE HIS REFILL TODAY INSTEAD OF SUNDAYN

## 2012-08-15 NOTE — Telephone Encounter (Signed)
Signed rx placed at front desk and detailed message left on pt's home #.

## 2012-08-15 NOTE — Telephone Encounter (Signed)
Rx last printed 07/16/12 #60 x no refills. Rx re-printed and forwarded to Provider to sign.

## 2012-09-17 ENCOUNTER — Telehealth: Payer: Self-pay | Admitting: Internal Medicine

## 2012-09-17 DIAGNOSIS — F988 Other specified behavioral and emotional disorders with onset usually occurring in childhood and adolescence: Secondary | ICD-10-CM

## 2012-09-17 NOTE — Telephone Encounter (Signed)
Patient is requesting a new prescription of adderall °

## 2012-09-18 MED ORDER — AMPHETAMINE-DEXTROAMPHETAMINE 20 MG PO TABS: 20.0000 mg | ORAL_TABLET | Freq: Two times a day (BID) | ORAL | Status: DC

## 2012-09-18 NOTE — Telephone Encounter (Signed)
Rx printed and forwarded to Provider for review. Left message on home# that we will call him tomorrow when rx is ready for pick up as Provider is out of the office today.

## 2012-09-19 NOTE — Telephone Encounter (Signed)
Rx signed and placed at front desk. Left detailed message on home # re: completion and to call if any questions.

## 2012-10-17 ENCOUNTER — Telehealth: Payer: Self-pay | Admitting: Internal Medicine

## 2012-10-17 DIAGNOSIS — F988 Other specified behavioral and emotional disorders with onset usually occurring in childhood and adolescence: Secondary | ICD-10-CM

## 2012-10-17 MED ORDER — AMPHETAMINE-DEXTROAMPHETAMINE 20 MG PO TABS: 20.0000 mg | ORAL_TABLET | Freq: Two times a day (BID) | ORAL | Status: DC

## 2012-10-17 NOTE — Telephone Encounter (Signed)
Patient is requesting a new prescription of Adderall.

## 2012-10-17 NOTE — Telephone Encounter (Signed)
OK to print 1 month supply. Needs OV prior to additional refills.

## 2012-10-17 NOTE — Telephone Encounter (Signed)
rx signed

## 2012-10-17 NOTE — Telephone Encounter (Signed)
Pt last seen in 03/2012 and advised 6 month follow up. No appts on file. Last adderall rx provided on 09/19/12.  Please advise.

## 2012-10-17 NOTE — Telephone Encounter (Signed)
Rx printed for Provider signature.

## 2012-10-20 ENCOUNTER — Telehealth: Payer: Self-pay | Admitting: Internal Medicine

## 2012-10-20 DIAGNOSIS — F988 Other specified behavioral and emotional disorders with onset usually occurring in childhood and adolescence: Secondary | ICD-10-CM

## 2012-10-20 MED ORDER — AMPHETAMINE-DEXTROAMPHETAMINE 20 MG PO TABS: 20.0000 mg | ORAL_TABLET | Freq: Two times a day (BID) | ORAL | Status: DC

## 2012-10-20 NOTE — Telephone Encounter (Signed)
Patient is requesting a new prescription of adderall °

## 2012-10-20 NOTE — Telephone Encounter (Signed)
Rx printed and left at the front desk.

## 2012-10-21 NOTE — Telephone Encounter (Signed)
Left message informing patient that rx was ready for pickup °

## 2012-11-17 ENCOUNTER — Telehealth: Payer: Self-pay | Admitting: Internal Medicine

## 2012-11-17 DIAGNOSIS — F988 Other specified behavioral and emotional disorders with onset usually occurring in childhood and adolescence: Secondary | ICD-10-CM

## 2012-11-17 MED ORDER — AMPHETAMINE-DEXTROAMPHETAMINE 20 MG PO TABS: 20.0000 mg | ORAL_TABLET | Freq: Two times a day (BID) | ORAL | Status: DC

## 2012-11-17 NOTE — Telephone Encounter (Signed)
Patient is requesting a refill of adderall.

## 2012-11-17 NOTE — Telephone Encounter (Signed)
Rx last provided on 10/20/12. Rx printed and forwarded to Provider for signature. Pt is past due for follow up and will need appt before further refills can be given.

## 2012-11-17 NOTE — Telephone Encounter (Signed)
Left detailed message on home # re: Rx completion and to call if any questions.

## 2012-12-17 ENCOUNTER — Other Ambulatory Visit: Payer: Self-pay | Admitting: Family

## 2012-12-17 DIAGNOSIS — F988 Other specified behavioral and emotional disorders with onset usually occurring in childhood and adolescence: Secondary | ICD-10-CM

## 2012-12-17 NOTE — Telephone Encounter (Signed)
Rx last printed on 11/17/12, #60 x no refills.  Pt is past due for 6 month follow up that was due in August.  Please advise.

## 2012-12-17 NOTE — Telephone Encounter (Signed)
Requesting refill on adderall

## 2012-12-18 NOTE — Telephone Encounter (Signed)
Needs OV prior to additional refills.  

## 2012-12-19 IMAGING — CR DG SHOULDER 2+V*L*
3 series · 3 of 3 positions shown · non-contrast
Comparison: None

CLINICAL DATA: Left shoulder pain post dislocation and self
reduction, history of prior dislocations

LEFT SHOULDER - 2+ VIEW

[w shoulder ap internal left]
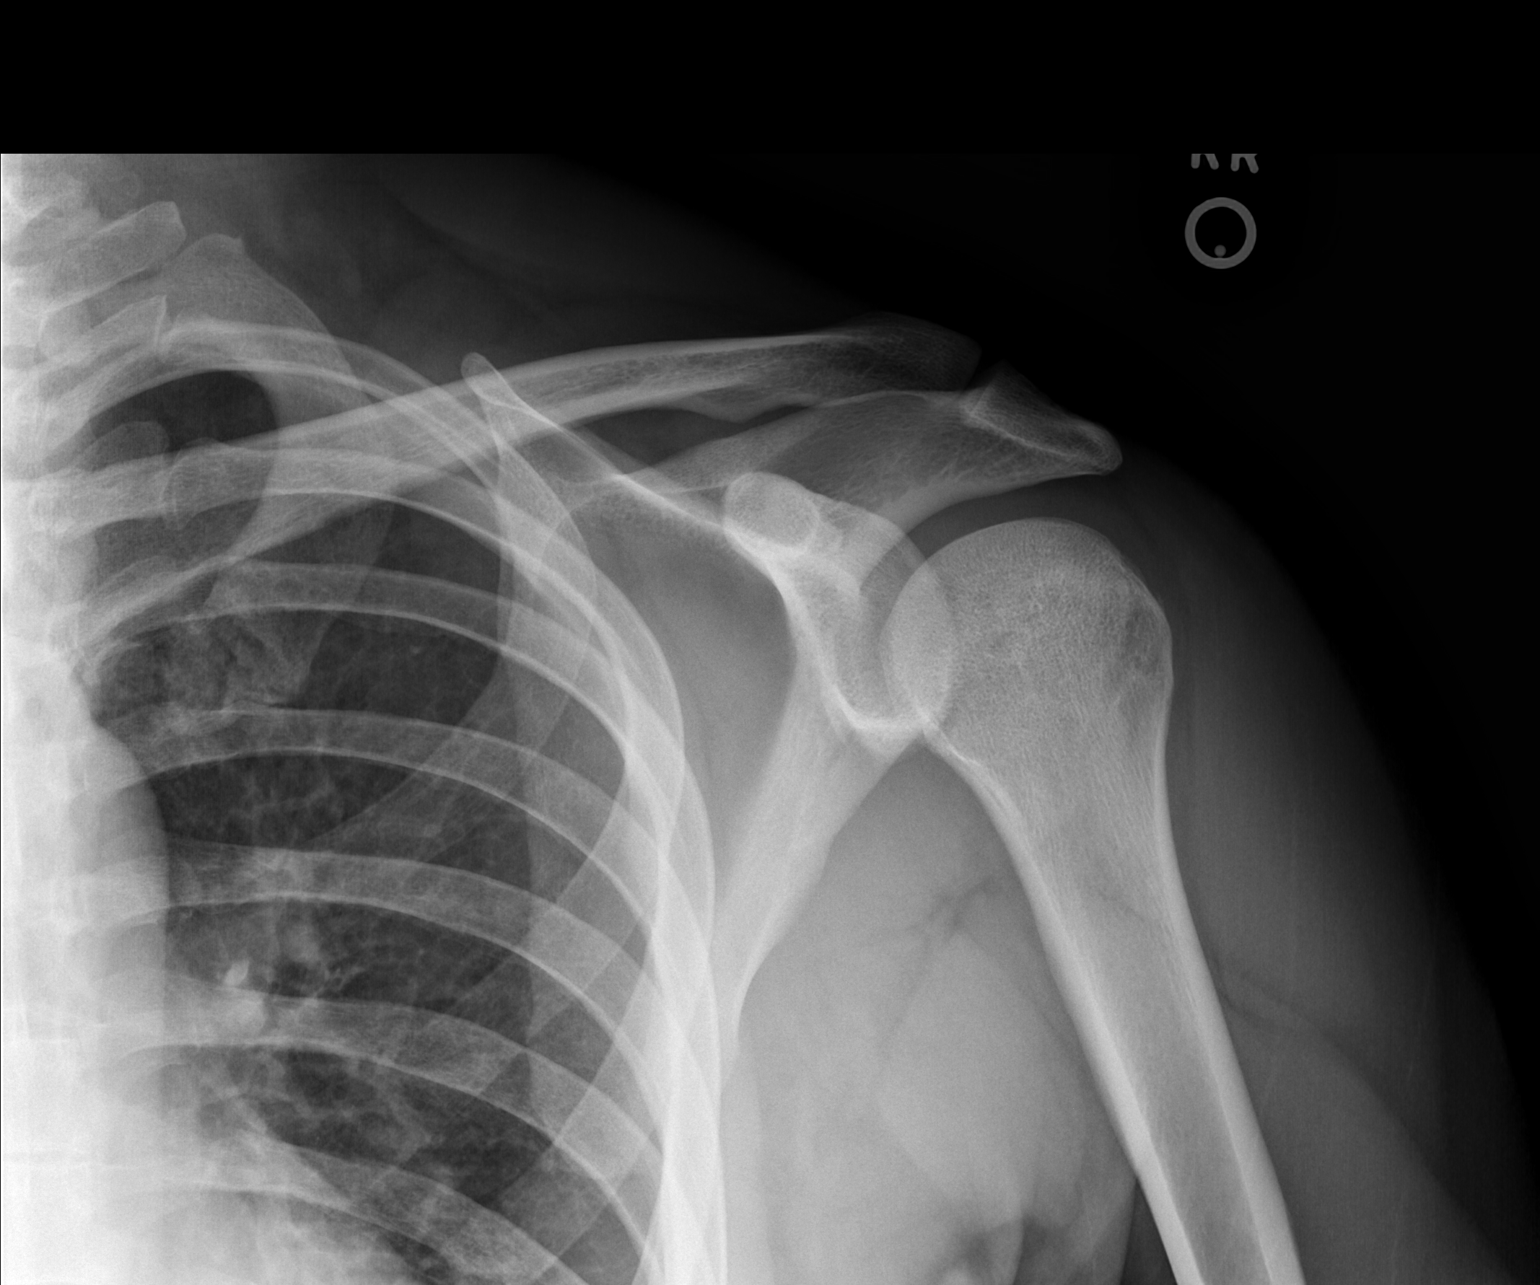

[w shoulder ap external left]
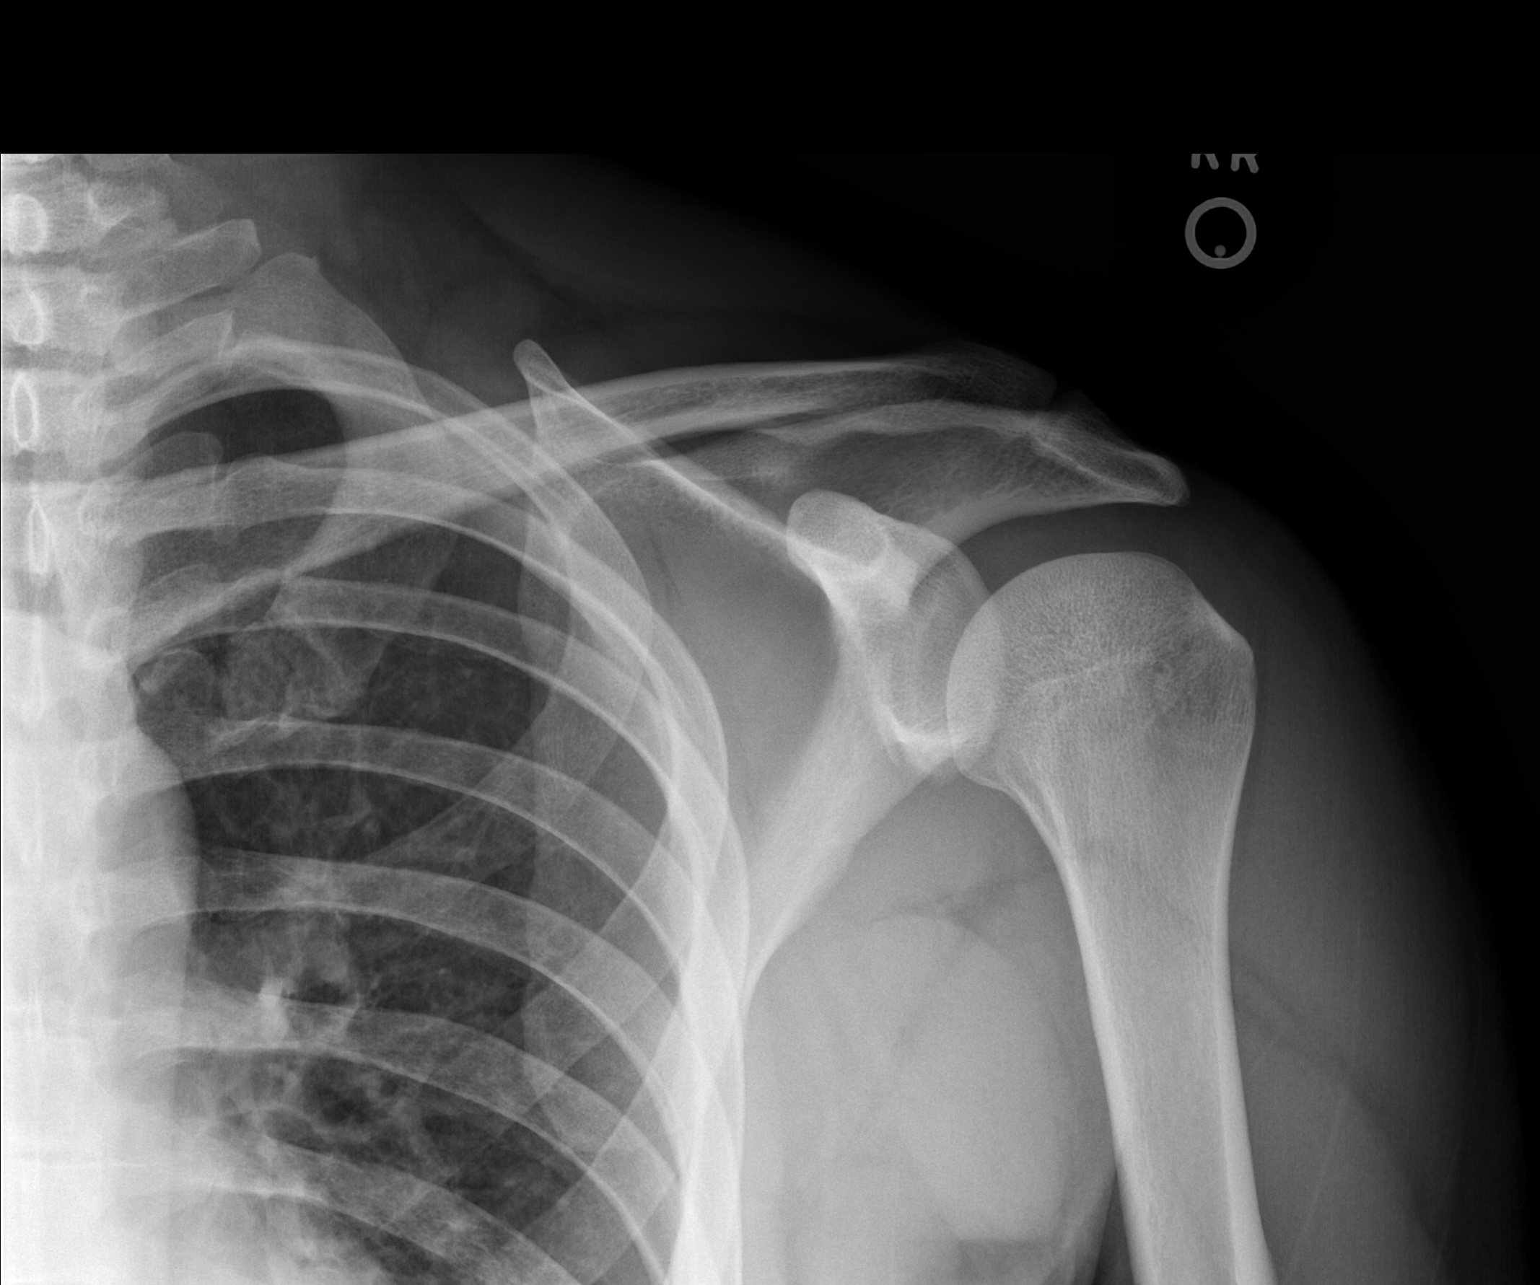

[w shoulder y view left]
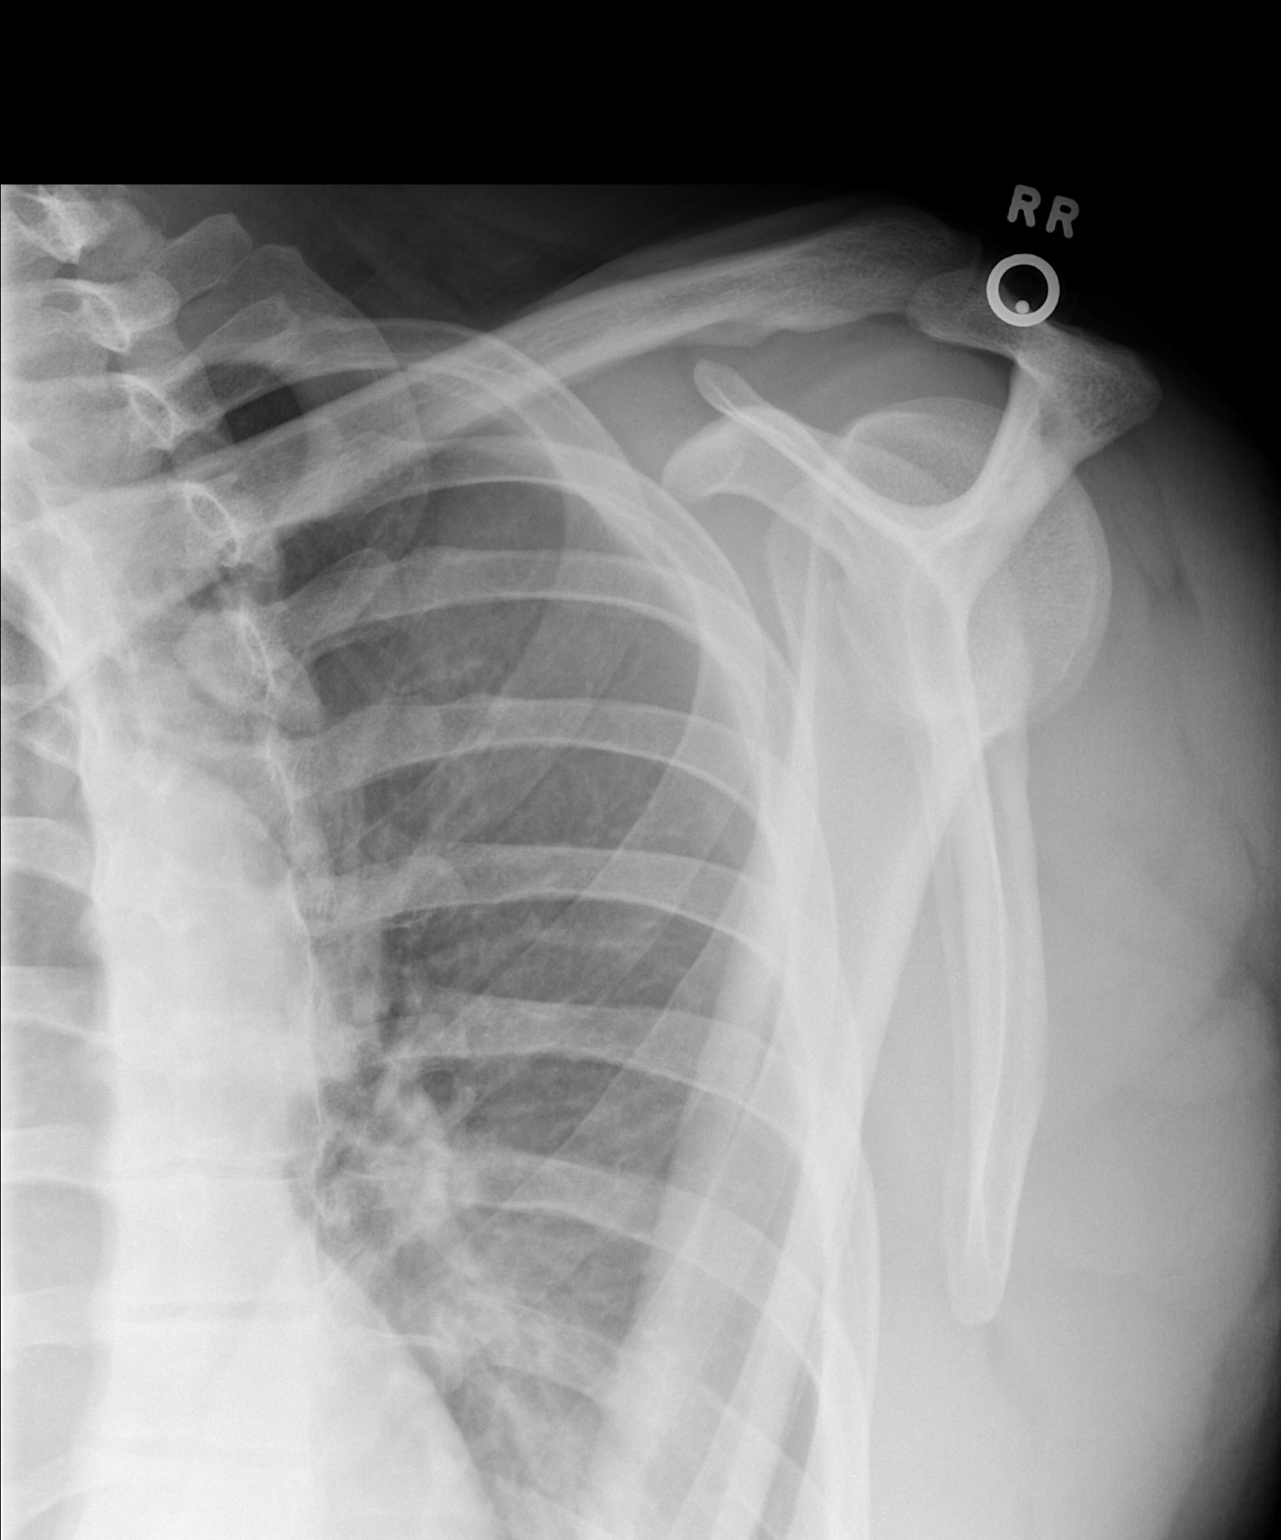

[3 of 3 positions shown; findings below may reference images not displayed]

FINDINGS: AC joint alignment normal.
Osseous mineralization normal.
No acute fracture, dislocation or bone destruction.
Visualized left ribs appear intact.
IMPRESSION: No acute bony abnormalities.

## 2012-12-19 NOTE — Telephone Encounter (Signed)
Notified pt and he scheduled follow up for Monday.

## 2012-12-19 NOTE — Telephone Encounter (Signed)
Left message to return my call.  

## 2012-12-22 ENCOUNTER — Encounter: Payer: Self-pay | Admitting: Family

## 2012-12-22 ENCOUNTER — Ambulatory Visit (INDEPENDENT_AMBULATORY_CARE_PROVIDER_SITE_OTHER): Payer: BC Managed Care – PPO | Admitting: Family

## 2012-12-22 VITALS — BP 110/86 | HR 60 | Temp 97.6°F | Resp 16 | Ht 72.0 in | Wt 229.0 lb

## 2012-12-22 DIAGNOSIS — F988 Other specified behavioral and emotional disorders with onset usually occurring in childhood and adolescence: Secondary | ICD-10-CM

## 2012-12-22 MED ORDER — AMPHETAMINE-DEXTROAMPHETAMINE 20 MG PO TABS: 20.0000 mg | ORAL_TABLET | Freq: Two times a day (BID) | ORAL | Status: DC

## 2012-12-22 NOTE — Assessment & Plan Note (Signed)
Stable.  Continue Adderall at same dose, refill provided. Follow up in 3 months for fasting physical.

## 2012-12-22 NOTE — Patient Instructions (Signed)
Please schedule a fasting physical for some time in the next 3 months.

## 2012-12-22 NOTE — Progress Notes (Signed)
Pre visit review using our clinic review tool, if applicable. No additional management support is needed unless otherwise documented below in the visit note. 

## 2012-12-22 NOTE — Progress Notes (Signed)
  Subjective:    Patient ID: Joel [name], male    DOB: December 16, 1985, 27 y.o.   MRN: [id number]  HPI Joel [name] is a 27 year old male who presents today for follow up of ADD. Patient requesting refill of Adderall.  Patient reports Adderall is working for his concentration due to school, work, and family. Denies problems with Adderall. Patient reports sleeping 8 hours daily, plays sports/works out daily, eating a balanced diet with fish, chicken, and vegetables.   Review of Systems  Constitutional: Negative for activity change, appetite change and fatigue.  HENT: Negative for rhinorrhea and sore throat.   Respiratory: Positive for cough. Negative for shortness of breath.        Patient reports he smokes 1/2 pack per day and is working towards quitting.   Cardiovascular: Negative for chest pain.  Gastrointestinal: Negative for abdominal pain.  Genitourinary: Negative for dysuria.  Neurological: Negative for dizziness and headaches.  Psychiatric/Behavioral: Negative for decreased concentration. The patient is not nervous/anxious.        No past medical history on file.  History   Social History  . Marital Status: Married    Spouse Name: N/A    Number of Children: N/A  . Years of Education: N/A   Occupational History  . Not on file.   Social History Main Topics  . Smoking status: Current Every Day Smoker -- 0.50 packs/day  . Smokeless tobacco: Never Used     Comment: <1/2 pack  per day  . Alcohol Use: No  . Drug Use: 7.00 per week    Special: Marijuana  . Sexual Activity: Yes   Other Topics Concern  . Not on file   Social History Narrative  . No narrative on file    Past Surgical History  Procedure Laterality Date  . No past surgeries      denies surgical history    Family History  Problem Relation Age of Onset  . Hypertension Mother   . Healthy Father   . Healthy Brother   . Sudden death Neg Hx   . Hyperlipidemia Neg Hx   . Heart attack Neg Hx   .  Diabetes Neg Hx     Allergies  Allergen Reactions  . Penicillins     Mother allergic to it. Pt never taken it.    No current outpatient prescriptions on file prior to visit.   No current facility-administered medications on file prior to visit.    BP 110/86  Pulse 60  Temp(Src) 97.6 F (36.4 C) (Oral)  Resp 16  Ht 6' (1.829 m)  Wt 229 lb (103.874 kg)  BMI 31.05 kg/m2  SpO2 98%    Objective:   Physical Exam  Constitutional: He is oriented to person, place, and time. He appears well-nourished.  Eyes: EOM are normal. Pupils are equal, round, and reactive to light.  Cardiovascular: Normal rate and regular rhythm.   Pulmonary/Chest: Effort normal and breath sounds normal.  Neurological: He is alert and oriented to person, place, and time.  Skin: Skin is warm and dry.  Psychiatric: He has a normal mood and affect. His behavior is normal.          Assessment & Plan:

## 2013-01-19 ENCOUNTER — Telehealth: Payer: Self-pay | Admitting: Family

## 2013-01-19 ENCOUNTER — Ambulatory Visit: Payer: BC Managed Care – PPO | Admitting: Family

## 2013-01-19 DIAGNOSIS — F988 Other specified behavioral and emotional disorders with onset usually occurring in childhood and adolescence: Secondary | ICD-10-CM

## 2013-01-19 MED ORDER — AMPHETAMINE-DEXTROAMPHETAMINE 20 MG PO TABS: 20.0000 mg | ORAL_TABLET | Freq: Two times a day (BID) | ORAL | Status: DC

## 2013-01-19 NOTE — Telephone Encounter (Signed)
adderall

## 2013-01-19 NOTE — Telephone Encounter (Signed)
Rx printed and forwarded to Provider for signature. 

## 2013-01-19 NOTE — Telephone Encounter (Signed)
Rx sent to front desk and notified pt. 

## 2013-02-02 ENCOUNTER — Encounter: Payer: BC Managed Care – PPO | Admitting: Family

## 2013-02-03 ENCOUNTER — Encounter: Payer: BC Managed Care – PPO | Admitting: Family

## 2013-02-17 ENCOUNTER — Telehealth: Payer: Self-pay | Admitting: Family

## 2013-02-17 DIAGNOSIS — F988 Other specified behavioral and emotional disorders with onset usually occurring in childhood and adolescence: Secondary | ICD-10-CM

## 2013-02-17 MED ORDER — AMPHETAMINE-DEXTROAMPHETAMINE 20 MG PO TABS: 20.0000 mg | ORAL_TABLET | Freq: Two times a day (BID) | ORAL | Status: DC

## 2013-02-17 NOTE — Telephone Encounter (Signed)
Request refill on adderall

## 2013-02-17 NOTE — Telephone Encounter (Signed)
See rx. 

## 2013-02-18 NOTE — Telephone Encounter (Signed)
Rx sent to front desk for pick up. Left detailed message on home # and to call if any questions.

## 2013-03-23 ENCOUNTER — Telehealth: Payer: Self-pay | Admitting: Family

## 2013-03-23 DIAGNOSIS — F988 Other specified behavioral and emotional disorders with onset usually occurring in childhood and adolescence: Secondary | ICD-10-CM

## 2013-03-23 MED ORDER — AMPHETAMINE-DEXTROAMPHETAMINE 20 MG PO TABS: 20.0000 mg | ORAL_TABLET | Freq: Two times a day (BID) | ORAL | Status: DC

## 2013-03-23 NOTE — Telephone Encounter (Signed)
Left detailed message on home# and rx sent to front desk.

## 2013-03-23 NOTE — Telephone Encounter (Signed)
Requesting refill on amphetamine-dextroamphetamine (ADDERALL) 20 MG tablet

## 2013-03-23 NOTE — Telephone Encounter (Signed)
Rx last printed 02/17/13.  Pt has f/u in March.  Rx printed and forwarded to Provider for signature.

## 2013-04-01 ENCOUNTER — Encounter: Payer: BC Managed Care – PPO | Admitting: Family

## 2013-04-14 ENCOUNTER — Telehealth: Payer: Self-pay | Admitting: Family

## 2013-04-14 ENCOUNTER — Ambulatory Visit (INDEPENDENT_AMBULATORY_CARE_PROVIDER_SITE_OTHER): Payer: BC Managed Care – PPO | Admitting: Family

## 2013-04-14 ENCOUNTER — Encounter: Payer: Self-pay | Admitting: Family

## 2013-04-14 VITALS — BP 102/76 | HR 88 | Temp 97.6°F | Resp 16 | Ht 72.0 in | Wt 227.1 lb

## 2013-04-14 DIAGNOSIS — Z0000 Encounter for general adult medical examination without abnormal findings: Secondary | ICD-10-CM

## 2013-04-14 LAB — CBC WITH DIFFERENTIAL/PLATELET
Basophils Absolute: 0 10*3/uL (ref 0.0–0.1)
Basophils Relative: 0 % (ref 0–1)
Eosinophils Absolute: 0.1 10*3/uL (ref 0.0–0.7)
Eosinophils Relative: 1 % (ref 0–5)
HCT: 45.5 % (ref 39.0–52.0)
Hemoglobin: 16.2 g/dL (ref 13.0–17.0)
Lymphocytes Relative: 39 % (ref 12–46)
Lymphs Abs: 2.8 10*3/uL (ref 0.7–4.0)
MCH: 32.4 pg (ref 26.0–34.0)
MCHC: 35.6 g/dL (ref 30.0–36.0)
MCV: 91 fL (ref 78.0–100.0)
Monocytes Absolute: 0.6 10*3/uL (ref 0.1–1.0)
Monocytes Relative: 8 % (ref 3–12)
Neutro Abs: 3.7 10*3/uL (ref 1.7–7.7)
Neutrophils Relative %: 52 % (ref 43–77)
Platelets: 363 10*3/uL (ref 150–400)
RBC: 5 MIL/uL (ref 4.22–5.81)
RDW: 13.2 % (ref 11.5–15.5)
WBC: 7.1 10*3/uL (ref 4.0–10.5)

## 2013-04-14 LAB — BASIC METABOLIC PANEL WITH GFR
BUN: 12 mg/dL (ref 6–23)
CO2: 28 mEq/L (ref 19–32)
Calcium: 9.7 mg/dL (ref 8.4–10.5)
Chloride: 105 mEq/L (ref 96–112)
Creat: 0.98 mg/dL (ref 0.50–1.35)
GFR, Est African American: >89 mL/min
GFR, Est Non African American: >89 mL/min
Glucose, Bld: 81 mg/dL (ref 70–99)
Potassium: 4.9 mEq/L (ref 3.5–5.3)
Sodium: 138 mEq/L (ref 135–145)

## 2013-04-14 LAB — LIPID PANEL
Cholesterol: 214 mg/dL — ABNORMAL HIGH (ref 0–200)
HDL: 42 mg/dL (ref >39)
LDL Cholesterol: 148 mg/dL — ABNORMAL HIGH (ref 0–99)
Total CHOL/HDL Ratio: 5.1 Ratio
Triglycerides: 122 mg/dL (ref <150)
VLDL: 24 mg/dL (ref 0–40)

## 2013-04-14 LAB — HEPATIC FUNCTION PANEL
ALT: 111 U/L — ABNORMAL HIGH (ref 0–53)
AST: 48 U/L — ABNORMAL HIGH (ref 0–37)
Albumin: 4.5 g/dL (ref 3.5–5.2)
Alkaline Phosphatase: 92 U/L (ref 39–117)
Bilirubin, Direct: 0.1 mg/dL (ref 0.0–0.3)
Indirect Bilirubin: 0.7 mg/dL (ref 0.2–1.2)
Total Bilirubin: 0.8 mg/dL (ref 0.2–1.2)
Total Protein: 7.1 g/dL (ref 6.0–8.3)

## 2013-04-14 NOTE — Telephone Encounter (Signed)
Relevant patient education mailed to patient.  

## 2013-04-14 NOTE — Patient Instructions (Signed)
Please complete lab work prior to leaving.  Follow up in 6 months.  

## 2013-04-14 NOTE — Progress Notes (Signed)
Pre visit review using our clinic review tool, if applicable. No additional management support is needed unless otherwise documented below in the visit note. 

## 2013-04-14 NOTE — Assessment & Plan Note (Signed)
Continue healthy diet, exercise. Immunizations up to date. Obtain fasting labs.  Counseled on importance of smoking cessation.

## 2013-04-14 NOTE — Progress Notes (Signed)
Subjective:    Patient ID: Joel [name], male    DOB: 1985-09-04, 28 y.o.   MRN: [id number]  HPI  Patient presents today for complete physical.  Immunizations: tetanus up to date Diet: reports healthy diet Exercise:  Daily- plays soccer, lifts weights Tobacco- 1/2 PPD.  Reports that he quit once for 1 year.  Wants to quit   Review of Systems  Constitutional: Negative for unexpected weight change.  HENT: Negative for hearing loss and rhinorrhea.        Gets regular dental cleanings  Eyes:       Wears glasses  Respiratory: Negative for cough and shortness of breath.   Cardiovascular: Negative for chest pain.  Gastrointestinal: Negative for nausea, vomiting and blood in stool.  Genitourinary: Negative for dysuria, frequency and hematuria.  Musculoskeletal: Negative for arthralgias and myalgias.  Skin: Negative for rash.  Neurological: Negative for headaches.  Hematological: Negative for adenopathy.  Psychiatric/Behavioral:       Denies depression/anxiety   Past Medical History  Diagnosis Date  . ADD (attention deficit disorder)   . Tobacco abuse   . Hyperlipemia     History   Social History  . Marital Status: Married    Spouse Name: N/A    Number of Children: N/A  . Years of Education: N/A   Occupational History  . Not on file.   Social History Main Topics  . Smoking status: Current Every Day Smoker -- 0.50 packs/day  . Smokeless tobacco: Never Used     Comment: <1/2 pack  per day  . Alcohol Use: No  . Drug Use: 7.00 per week    Special: Marijuana  . Sexual Activity: Yes   Other Topics Concern  . Not on file   Social History Narrative   Works for [employer]   + tobacco abuse   2 children- daughter age 40 and daughter age 74   Married   Grew up in [place], moved here at age 64   Completed high school   Enjoys outdoor activities   Working on Restaurant manager, fast food          Past Surgical History  Procedure  Laterality Date  . No past surgeries      denies surgical history    Family History  Problem Relation Age of Onset  . Hypertension Mother   . Healthy Father   . Healthy Brother   . Sudden death Neg Hx   . Hyperlipidemia Neg Hx   . Heart attack Neg Hx   . Diabetes Neg Hx     Allergies  Allergen Reactions  . Penicillins     Mother allergic to it. Pt never taken it.    Current Outpatient Prescriptions on File Prior to Visit  Medication Sig Dispense Refill  . amphetamine-dextroamphetamine (ADDERALL) 20 MG tablet Take 1 tablet (20 mg total) by mouth 2 (two) times daily.  60 tablet  0   No current facility-administered medications on file prior to visit.    BP 102/76  Pulse 88  Temp(Src) 97.6 F (36.4 C) (Oral)  Resp 16  Ht 6' (1.829 m)  Wt 227 lb 1.9 oz (103.021 kg)  BMI 30.80 kg/m2  SpO2 99%       Objective:   Physical Exam  Physical Exam  Constitutional: He is oriented to person, place, and time. He appears well-developed and well-nourished. No distress.  HENT:  Head: Normocephalic and atraumatic.  Right Ear: Tympanic membrane and ear canal normal.  Left Ear: Tympanic membrane and ear canal normal.  Mouth/Throat: Oropharynx is clear and moist. some missing molars noted Eyes: Pupils are equal, round, and reactive to light. No scleral icterus.  Neck: Normal range of motion. No thyromegaly present.  Cardiovascular: Normal rate and regular rhythm.   No murmur heard. Pulmonary/Chest: Effort normal and breath sounds normal. No respiratory distress. He has no wheezes. He has no rales. He exhibits no tenderness.  Abdominal: Soft. Bowel sounds are normal. He exhibits no distension and no mass. There is no tenderness. There is no rebound and no guarding.  Musculoskeletal: He exhibits no edema.  Lymphadenopathy:    He has no cervical adenopathy.  Neurological: He is alert and oriented to person, place, and time.  He exhibits normal muscle tone. Coordination normal.    Skin: Skin is warm and dry.  Psychiatric: He has a normal mood and affect. His behavior is normal. Judgment and thought content normal.          Assessment & Plan:         Assessment & Plan:

## 2013-04-15 ENCOUNTER — Encounter: Payer: Self-pay | Admitting: Family

## 2013-04-15 DIAGNOSIS — K760 Fatty (change of) liver, not elsewhere classified: Secondary | ICD-10-CM | POA: Insufficient documentation

## 2013-04-15 LAB — URINALYSIS, ROUTINE W REFLEX MICROSCOPIC
Bilirubin Urine: NEGATIVE
Glucose, UA: NEGATIVE mg/dL
Hgb urine dipstick: NEGATIVE
Ketones, ur: NEGATIVE mg/dL
Leukocytes, UA: NEGATIVE
Nitrite: NEGATIVE
Protein, ur: NEGATIVE mg/dL
Specific Gravity, Urine: 1.02 (ref 1.005–1.030)
Urobilinogen, UA: 0.2 mg/dL (ref 0.0–1.0)
pH: 7 (ref 5.0–8.0)

## 2013-04-15 LAB — TSH: TSH: 0.758 u[IU]/mL (ref 0.350–4.500)

## 2013-04-22 ENCOUNTER — Telehealth: Payer: Self-pay | Admitting: Family

## 2013-04-22 DIAGNOSIS — F988 Other specified behavioral and emotional disorders with onset usually occurring in childhood and adolescence: Secondary | ICD-10-CM

## 2013-04-22 MED ORDER — AMPHETAMINE-DEXTROAMPHETAMINE 20 MG PO TABS: 20.0000 mg | ORAL_TABLET | Freq: Two times a day (BID) | ORAL | Status: DC

## 2013-04-22 NOTE — Telephone Encounter (Signed)
Last Rx provided on 03/23/13. Rx printed and forwarded to Provider for signature.

## 2013-04-22 NOTE — Telephone Encounter (Signed)
Patient is requesting a new prescription of adderall

## 2013-04-22 NOTE — Telephone Encounter (Signed)
Left a voicemail on pt's home # to pick up his Rx.

## 2013-05-26 ENCOUNTER — Telehealth: Payer: Self-pay | Admitting: Family

## 2013-05-26 DIAGNOSIS — F988 Other specified behavioral and emotional disorders with onset usually occurring in childhood and adolescence: Secondary | ICD-10-CM

## 2013-05-26 MED ORDER — AMPHETAMINE-DEXTROAMPHETAMINE 20 MG PO TABS: 20.0000 mg | ORAL_TABLET | Freq: Two times a day (BID) | ORAL | Status: DC

## 2013-05-26 NOTE — Telephone Encounter (Signed)
Left detailed message on home # that rx is ready for pick up at the front desk.

## 2013-05-26 NOTE — Telephone Encounter (Signed)
Last Rx printed 04/22/13, #60.  Pt has f/u in 09/2013. Rx printed and forwarded to [facility] for signature.

## 2013-05-26 NOTE — Telephone Encounter (Signed)
Patient is requesting a new adderall rx °

## 2013-06-09 ENCOUNTER — Ambulatory Visit (INDEPENDENT_AMBULATORY_CARE_PROVIDER_SITE_OTHER): Payer: BC Managed Care – PPO | Admitting: Family Medicine

## 2013-06-09 ENCOUNTER — Encounter: Payer: Self-pay | Admitting: Family Medicine

## 2013-06-09 VITALS — BP 108/70 | HR 64 | Temp 98.0°F | Wt 226.0 lb

## 2013-06-09 DIAGNOSIS — T63461A Toxic effect of venom of wasps, accidental (unintentional), initial encounter: Secondary | ICD-10-CM

## 2013-06-09 DIAGNOSIS — T63441A Toxic effect of venom of bees, accidental (unintentional), initial encounter: Secondary | ICD-10-CM

## 2013-06-09 DIAGNOSIS — T6391XA Toxic effect of contact with unspecified venomous animal, accidental (unintentional), initial encounter: Secondary | ICD-10-CM

## 2013-06-09 MED ORDER — METHYLPREDNISOLONE ACETATE 80 MG/ML IJ SUSP
80.0000 mg | Freq: Once | INTRAMUSCULAR | Status: AC
  Administered 2013-06-09: 80 mg via INTRAMUSCULAR

## 2013-06-09 MED ORDER — PREDNISONE 10 MG PO TABS: ORAL_TABLET | ORAL | Status: DC

## 2013-06-09 NOTE — Patient Instructions (Signed)
Bee, Wasp, or Hornet Sting Your caregiver has diagnosed you as having an insect sting. An insect sting appears as a red lump in the skin that sometimes has a tiny hole in the center, or it may have a stinger in the center of the wound. The most common stings are from wasps, hornets and bees. Individuals have different reactions to insect stings.  A normal reaction may cause pain, swelling, and redness around the sting site.  A localized allergic reaction may cause swelling and redness that extends beyond the sting site.  A large local reaction may continue to develop over the next 12 to 36 hours.  On occasion, the reactions can be severe (anaphylactic reaction). An anaphylactic reaction may cause wheezing; difficulty breathing; chest pain; fainting; raised, itchy, red patches on the skin; a sick feeling to your stomach (nausea); vomiting; cramping; or diarrhea. If you have had an anaphylactic reaction to an insect sting in the past, you are more likely to have one again. HOME CARE INSTRUCTIONS   With bee stings, a small sac of poison is left in the wound. Brushing across this with something such as a credit card, or anything similar, will help remove this and decrease the amount of the reaction. This same procedure will not help a wasp sting as they do not leave behind a stinger and poison sac.  Apply a cold compress for 10 to 20 minutes every hour for 1 to 2 days, depending on severity, to reduce swelling and itching.  To lessen pain, a paste made of water and baking soda may be rubbed on the bite or sting and left on for 5 minutes.  To relieve itching and swelling, you may use take medication or apply medicated creams or lotions as directed.  Only take over-the-counter or prescription medicines for pain, discomfort, or fever as directed by your caregiver.  Wash the sting site daily with soap and water. Apply antibiotic ointment on the sting site as directed.  If you suffered a severe  reaction:  If you did not require hospitalization, an adult will need to stay with you for 24 hours in case the symptoms return.  You may need to wear a medical bracelet or necklace stating the allergy.  You and your family need to learn when and how to use an anaphylaxis kit or epinephrine injection.  If you have had a severe reaction before, always carry your anaphylaxis kit with you. SEEK MEDICAL CARE IF:   None of the above helps within 2 to 3 days.  The area becomes red, warm, tender, and swollen beyond the area of the bite or sting.  You have an oral temperature above 102 F (38.9 C). SEEK IMMEDIATE MEDICAL CARE IF:  You have symptoms of an allergic reaction which are:  Wheezing.  Difficulty breathing.  Chest pain.  Lightheadedness or fainting.  Itchy, raised, red patches on the skin.  Nausea, vomiting, cramping or diarrhea. ANY OF THESE SYMPTOMS MAY REPRESENT A SERIOUS PROBLEM THAT IS AN EMERGENCY. Do not wait to see if the symptoms will go away. Get medical help right away. Call your local emergency services (911 in [place] DO NOT drive yourself to the hospital. MAKE SURE YOU:   Understand these instructions.  Will watch your condition.  Will get help right away if you are not doing well or get worse. Document Released: 01/15/2005 Document Revised: 04/09/2011 Document Reviewed: 07/02/2009 [employer] Patient Information 2014 [employer].  

## 2013-06-09 NOTE — Progress Notes (Signed)
   Subjective:    Patient ID: Joel [name], male    DOB: 1986-01-22, 28 y.o.   MRN: [id number]  HPI Pt here c/o bee sting yesterday.  He is allergic but has no trouble breathing or cp.  Reaction is local only---itchy but is slowly spreading   Review of Systems As above    Objective:   Physical Exam  BP 108/70  Pulse 64  Temp(Src) 98 F (36.7 C) (Oral)  Wt 226 lb (102.513 kg)  SpO2 98% General appearance: alert, cooperative, appears stated age and no distress Lungs: clear to auscultation bilaterally Heart: regular rate and rhythm, S1, S2 normal, no murmur, click, rub or gallop Skin: hives and errythema around bee sting L low abd      Assessment & Plan:  1. Bee sting reaction Depo medrol and con't prn benadryl - predniSONE (DELTASONE) 10 MG tablet; 3 po qd for 3 days then 2 po qd for 3 days the 1 po qd for 3 days  Dispense: 18 tablet; Refill: 0

## 2013-06-09 NOTE — Progress Notes (Signed)
Pre visit review using our clinic review tool, if applicable. No additional management support is needed unless otherwise documented below in the visit note. 

## 2013-06-09 NOTE — Addendum Note (Signed)
Addended by: Rudene Anda on: 06/09/2013 02:01 PM   Modules accepted: Orders

## 2013-06-25 ENCOUNTER — Telehealth: Payer: Self-pay | Admitting: Family

## 2013-06-25 DIAGNOSIS — F988 Other specified behavioral and emotional disorders with onset usually occurring in childhood and adolescence: Secondary | ICD-10-CM

## 2013-06-25 NOTE — Telephone Encounter (Signed)
Patient is requesting a new adderall rx °

## 2013-06-26 MED ORDER — AMPHETAMINE-DEXTROAMPHETAMINE 20 MG PO TABS: 20.0000 mg | ORAL_TABLET | Freq: Two times a day (BID) | ORAL | Status: DC

## 2013-06-26 NOTE — Telephone Encounter (Signed)
Notified pt and left rx at front desk for pick up.

## 2013-06-26 NOTE — Telephone Encounter (Signed)
Last Rx printed 05/26/13, #60. Rx printed and forwarded to Provider for signature.

## 2013-07-23 ENCOUNTER — Ambulatory Visit (INDEPENDENT_AMBULATORY_CARE_PROVIDER_SITE_OTHER): Payer: BC Managed Care – PPO | Admitting: Physician Assistant

## 2013-07-23 ENCOUNTER — Telehealth: Payer: Self-pay | Admitting: Family

## 2013-07-23 ENCOUNTER — Encounter: Payer: Self-pay | Admitting: Physician Assistant

## 2013-07-23 VITALS — BP 122/84 | HR 98 | Temp 98.5°F | Resp 16 | Ht 72.0 in | Wt 229.0 lb

## 2013-07-23 DIAGNOSIS — F988 Other specified behavioral and emotional disorders with onset usually occurring in childhood and adolescence: Secondary | ICD-10-CM

## 2013-07-23 DIAGNOSIS — Z91030 Bee allergy status: Secondary | ICD-10-CM

## 2013-07-23 DIAGNOSIS — Z91038 Other insect allergy status: Secondary | ICD-10-CM

## 2013-07-23 MED ORDER — EPINEPHRINE 0.3 MG/0.3ML IJ SOAJ: 0.3000 mg | Freq: Once | INTRAMUSCULAR | Status: DC

## 2013-07-23 NOTE — Patient Instructions (Signed)
Please keep Epi Pen on hand at work.  Read instructions for safe storage.  Show your wife and a coworker how to use the device.  You can go to [email or web address] to watch the video again

## 2013-07-23 NOTE — Progress Notes (Signed)
Pre visit review using our clinic review tool, if applicable. No additional management support is needed unless otherwise documented below in the visit note/SLS  

## 2013-07-23 NOTE — Telephone Encounter (Signed)
Please Advise

## 2013-07-23 NOTE — Telephone Encounter (Signed)
Needs rx for adderall

## 2013-07-24 DIAGNOSIS — Z91030 Bee allergy status: Secondary | ICD-10-CM | POA: Insufficient documentation

## 2013-07-24 MED ORDER — AMPHETAMINE-DEXTROAMPHETAMINE 20 MG PO TABS: 20.0000 mg | ORAL_TABLET | Freq: Two times a day (BID) | ORAL | Status: DC

## 2013-07-24 NOTE — Telephone Encounter (Signed)
Left detailed message on home# that rx is at front desk for pick up.

## 2013-07-24 NOTE — Telephone Encounter (Signed)
See rx,

## 2013-07-24 NOTE — Progress Notes (Signed)
Patient presents to clinic today requesting prescription for Epi Pen.  Patient states he has had two incidents of bee stings this summer.  The first was about a month ago and ended in a localized reaction with redness and swelling.  The second, a week ago, resulted in localized swelling but also hives, facial flushing and difficulty breathing.  Patient states he was taken to an ER where he was given medication to feel better.  Patient states his work will not allow him to return (He does a lot of outdoor work), until he has been given an Epi Pen and instructed on how to use it.  Past Medical History  Diagnosis Date  . ADD (attention deficit disorder)   . Tobacco abuse   . Hyperlipemia     No current outpatient prescriptions on file prior to visit.   No current facility-administered medications on file prior to visit.    Allergies  Allergen Reactions  . Bee Venom Shortness Of Breath    Yellow Jacket  . Penicillins     Mother allergic to it. Pt never taken it.    Family History  Problem Relation Age of Onset  . Hypertension Mother   . Healthy Father   . Healthy Brother   . Sudden death Neg Hx   . Hyperlipidemia Neg Hx   . Heart attack Neg Hx   . Diabetes Neg Hx     History   Social History  . Marital Status: Married    Spouse Name: N/A    Number of Children: N/A  . Years of Education: N/A   Social History Main Topics  . Smoking status: Current Every Day Smoker -- 0.50 packs/day  . Smokeless tobacco: Never Used     Comment: <1/2 pack  per day  . Alcohol Use: No  . Drug Use: 7.00 per week    Special: Marijuana  . Sexual Activity: Yes   Other Topics Concern  . None   Social History Narrative   Works for [employer]   + tobacco abuse   2 children- daughter age 73 and daughter age 33   Married   Grew up in [place], moved here at age 20   Completed high school   Enjoys outdoor activities   Working on Restaurant manager, fast food          Review of Systems - See HPI.  All other ROS are negative.  BP 122/84  Pulse 98  Temp(Src) 98.5 F (36.9 C) (Oral)  Resp 16  Ht 6' (1.829 m)  Wt 229 lb (103.874 kg)  BMI 31.05 kg/m2  SpO2 98%  Physical Exam  Vitals reviewed. Constitutional: He is oriented to person, place, and time and well-developed, well-nourished, and in no distress.  HENT:  Head: Normocephalic and atraumatic.  Eyes: Conjunctivae are normal.  Cardiovascular: Normal rate, regular rhythm, normal heart sounds and intact distal pulses.   Pulmonary/Chest: Effort normal and breath sounds normal. No respiratory distress. He has no wheezes. He has no rales. He exhibits no tenderness.  Neurological: He is alert and oriented to person, place, and time.   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 Rx Epi Pen.  Showed patient video on proper use of Pen and demonstrated personally in office.  Instructed patient to show a family member, friend and coworker how to use the Pen as well.  Patient is aware to still seek emergent medical help if stung.

## 2013-07-24 NOTE — Assessment & Plan Note (Signed)
Previous reaction -- anaphylactic. Rx Epi Pen.  Showed patient video on proper use of Pen and demonstrated personally in office.  Instructed patient to show a family member, friend and coworker how to use the Pen as well.  Patient is aware to still seek emergent medical help if stung.

## 2013-07-24 NOTE — Addendum Note (Signed)
Addended by: [name] on: 07/24/2013 07:11 AM   Modules accepted: Orders

## 2013-07-27 IMAGING — US US ABDOMEN COMPLETE
1 series · 14 of 25 positions shown · non-contrast
Comparison: None.

CLINICAL DATA: Elevated liver function tests.

ABDOMINAL ULTRASOUND COMPLETE

[Series 1: us abdomen complete · 0.37mm/px · 14 of 67 slices shown]
[im 1/67]
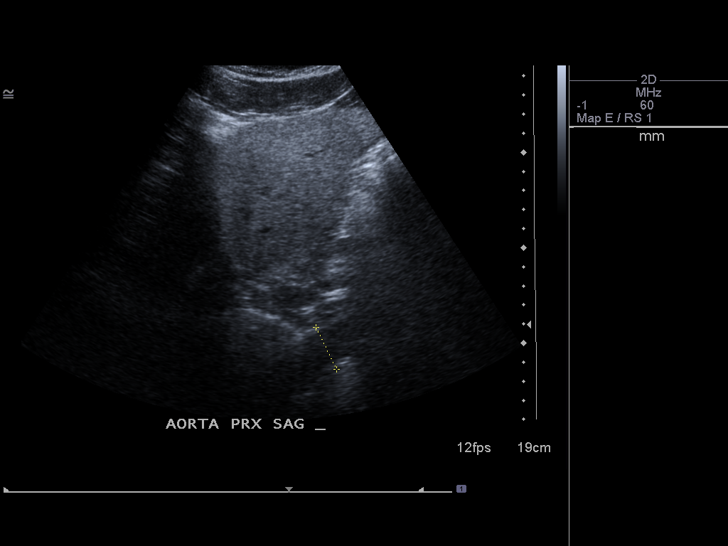
[im 6/67]
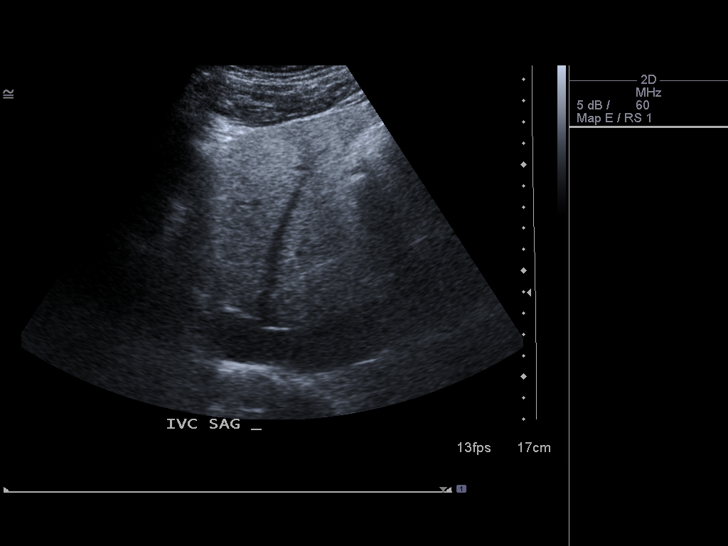
[im 12/67]
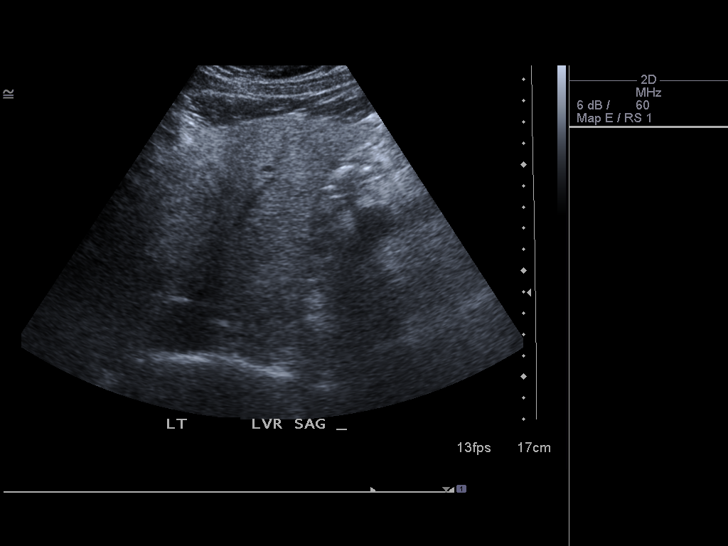
[im 17/67]
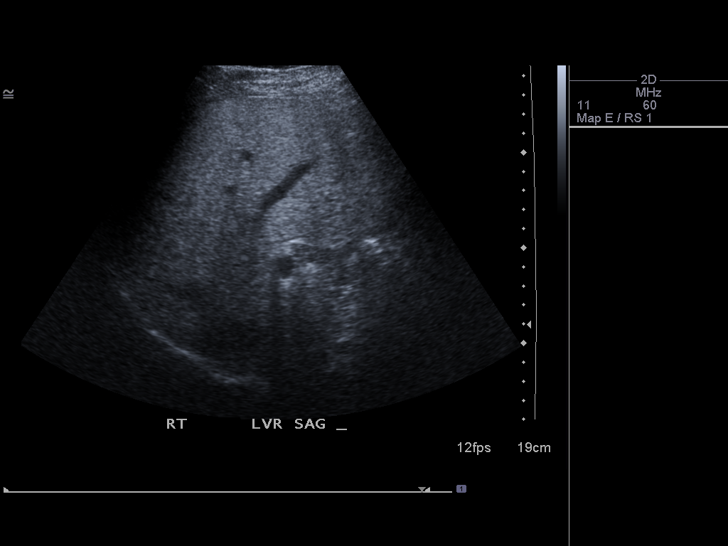
[im 23/67]
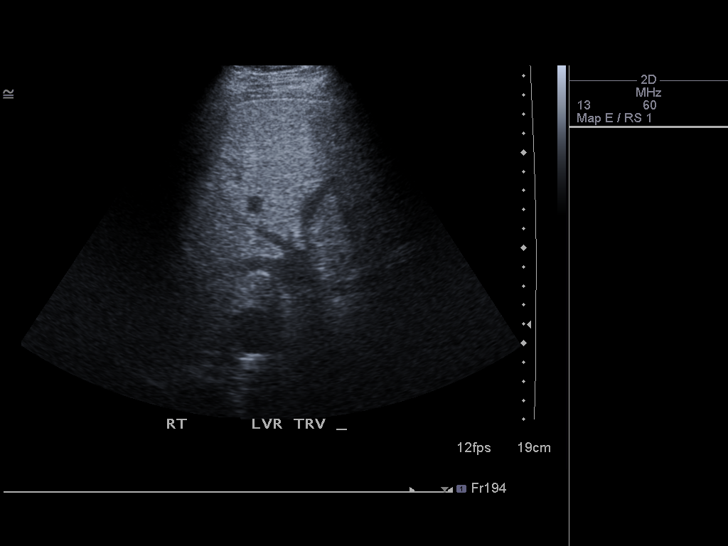
[im 25/67]
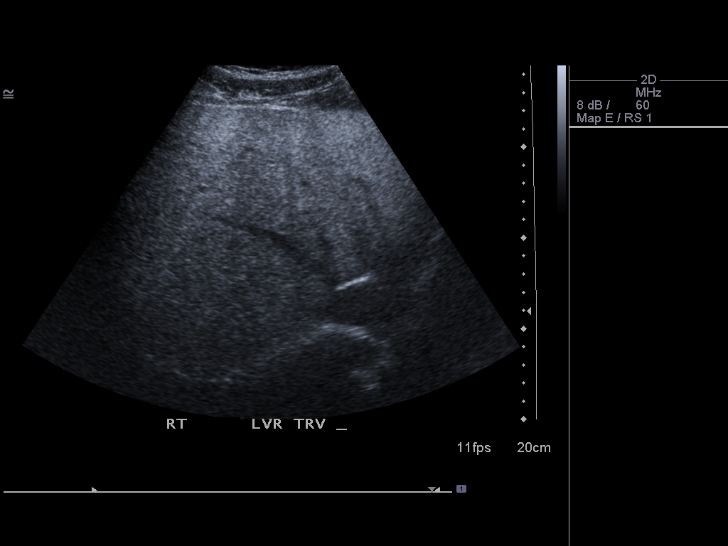
[im 31/67]
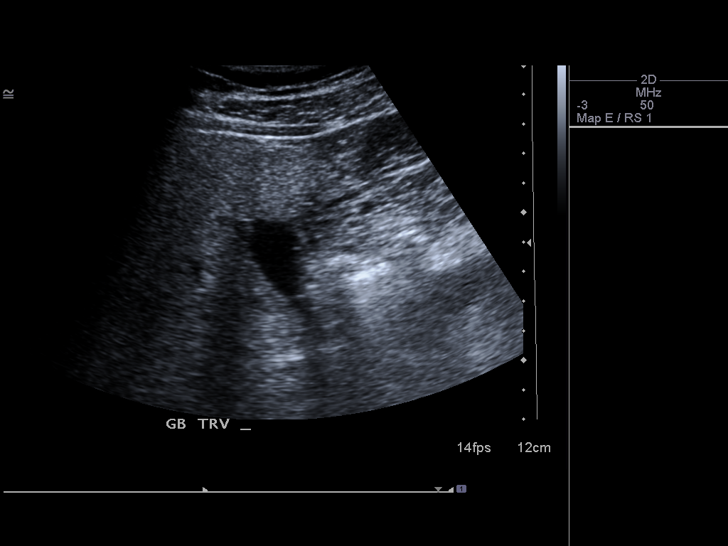
[im 36/67]
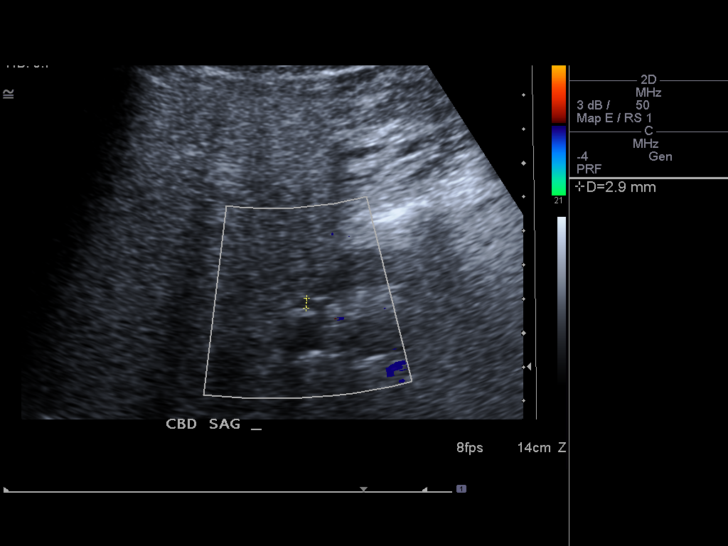
[im 42/67]
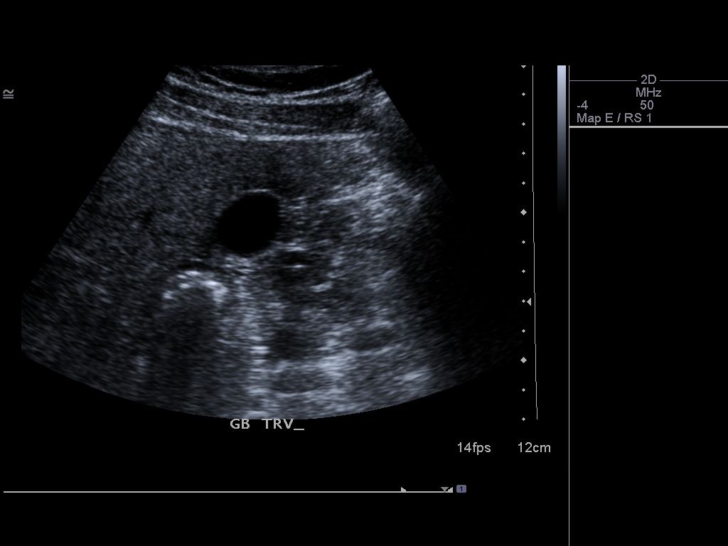
[im 45/67]
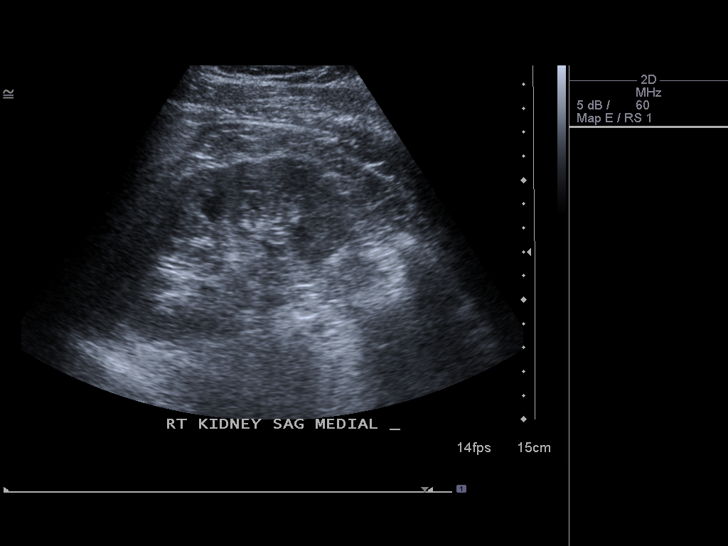
[im 50/67]
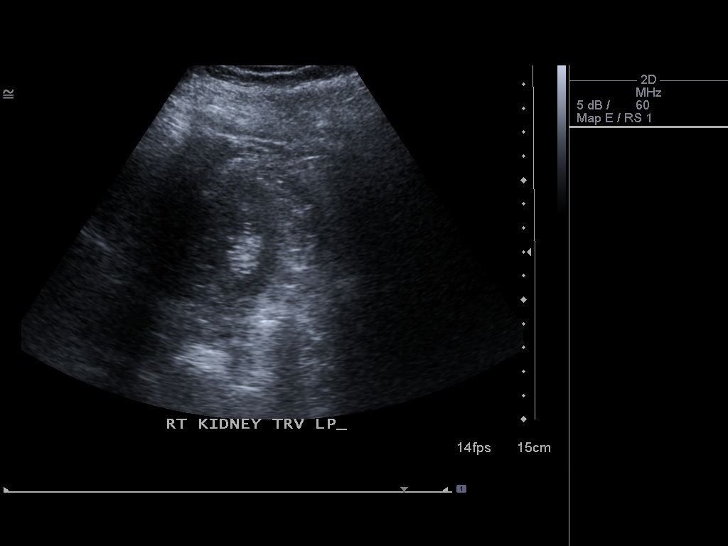
[im 56/67]
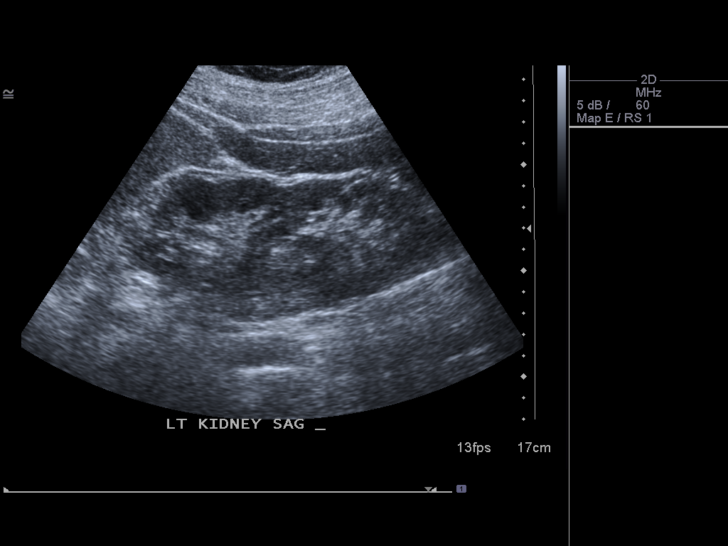
[im 61/67]
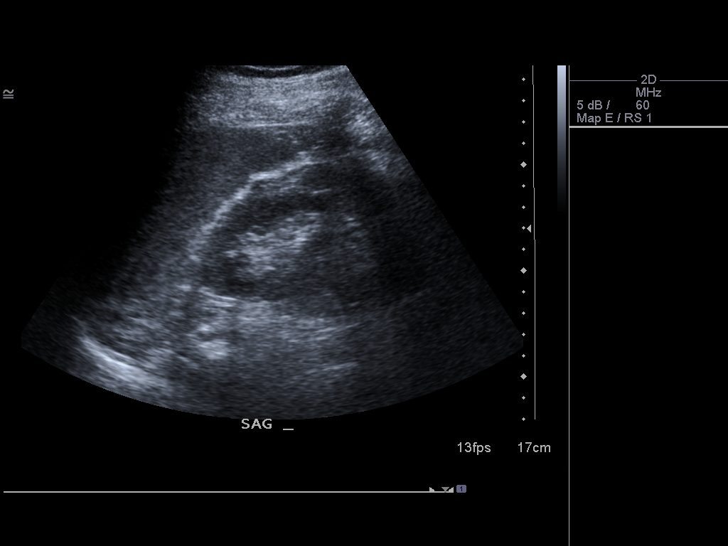
[im 67/67]
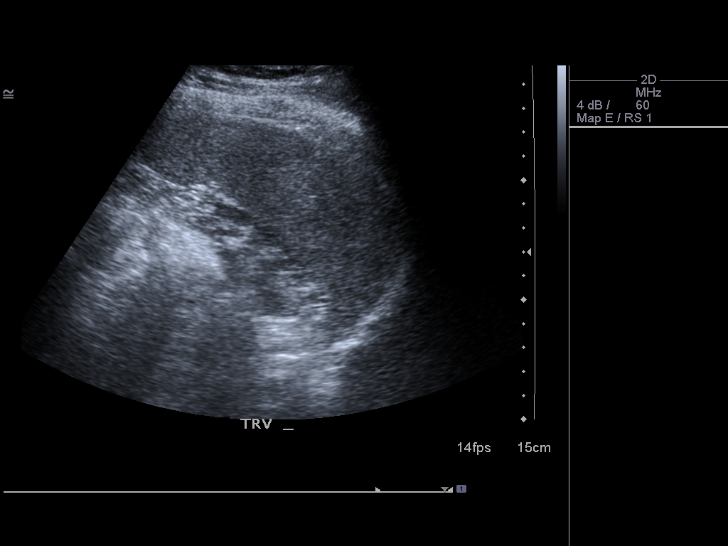

[14 of 25 positions shown; findings below may reference images not displayed]

FINDINGS: Gallbladder:  No gallstones, gallbladder wall thickening, or
pericholecystic fluid.

Common Bile Duct:  Within normal limits in caliber. Measures 3 mm
in diameter.

Liver: Diffusely increased parenchymal echogenicity, consistent
with hepatic steatosis.  No focal liver mass identified.

IVC:  Appears normal.

Pancreas:  No abnormality identified.

Spleen:  Within normal limits in size and echotexture.

Right kidney:  Normal in size and parenchymal echogenicity.  No
evidence of mass or hydronephrosis.

Left kidney:  Normal in size and parenchymal echogenicity.  No
evidence of mass or hydronephrosis.

Abdominal Aorta:  No aneurysm identified.
IMPRESSION: 1.  Hepatic steatosis.
2.  No evidence of gallstones, biliary dilatation, or other
significant abnormality.

## 2013-08-26 ENCOUNTER — Telehealth: Payer: Self-pay | Admitting: Family

## 2013-08-26 DIAGNOSIS — F988 Other specified behavioral and emotional disorders with onset usually occurring in childhood and adolescence: Secondary | ICD-10-CM

## 2013-08-26 MED ORDER — AMPHETAMINE-DEXTROAMPHETAMINE 20 MG PO TABS: 20.0000 mg | ORAL_TABLET | Freq: Two times a day (BID) | ORAL | Status: DC

## 2013-08-26 NOTE — Telephone Encounter (Signed)
Last rx provided 07/24/13, #60. Rx printed and forwarded to [facility] for signature.

## 2013-08-26 NOTE — Telephone Encounter (Signed)
Needs his adderall rx to pick up

## 2013-08-26 NOTE — Telephone Encounter (Signed)
Rx signed and sent to front desk for pick up. Left detailed message on pt's home #.

## 2013-08-27 ENCOUNTER — Telehealth: Payer: Self-pay | Admitting: *Deleted

## 2013-09-07 ENCOUNTER — Telehealth: Payer: Self-pay | Admitting: Family

## 2013-09-07 ENCOUNTER — Encounter: Payer: Self-pay | Admitting: Family

## 2013-09-07 ENCOUNTER — Ambulatory Visit (INDEPENDENT_AMBULATORY_CARE_PROVIDER_SITE_OTHER): Payer: BC Managed Care – PPO | Admitting: Family

## 2013-09-07 VITALS — BP 120/86 | HR 112 | Temp 98.5°F | Resp 18 | Ht 72.0 in | Wt 224.1 lb

## 2013-09-07 DIAGNOSIS — K649 Unspecified hemorrhoids: Secondary | ICD-10-CM

## 2013-09-07 DIAGNOSIS — R0609 Other forms of dyspnea: Secondary | ICD-10-CM

## 2013-09-07 DIAGNOSIS — R0989 Other specified symptoms and signs involving the circulatory and respiratory systems: Secondary | ICD-10-CM

## 2013-09-07 DIAGNOSIS — IMO0001 Reserved for inherently not codable concepts without codable children: Secondary | ICD-10-CM

## 2013-09-07 DIAGNOSIS — M7918 Myalgia, other site: Secondary | ICD-10-CM

## 2013-09-07 DIAGNOSIS — G4710 Hypersomnia, unspecified: Secondary | ICD-10-CM

## 2013-09-07 DIAGNOSIS — R400 Somnolence: Secondary | ICD-10-CM

## 2013-09-07 DIAGNOSIS — R0683 Snoring: Secondary | ICD-10-CM

## 2013-09-07 MED ORDER — HYDROCORTISONE ACE-PRAMOXINE 1-1 % RE FOAM: 1.0000 | Freq: Two times a day (BID) | RECTAL | Status: DC

## 2013-09-07 NOTE — Progress Notes (Signed)
Subjective:    Patient ID: Joel [name], male    DOB: November 11, 1985, 28 y.o.   MRN: [id number]  HPI  Joel [name] is a 28 yr old male who presents today to discuss possible OSA. Reports that he "failed" the preop sleep apnea questionairre for pending R ACL repair.  and was referred back to [place] for further evaluation.   Reports that he had knee injury on 07/24/13.  Wife notes + snoring, + pauses- as long as 17 seconds.  reports that he sometimes wakes up feeling tired. Reports that in the afternoon between 3 and 5 when he is off from work, he will doze off. Wakes up with sore throat, dryness.    Pain in the right side, started 1 month ago.  Reports that pain is located under the right ribs.  It is a cramping pain. Denies associated nausea/anorexia.    Hemorrhoids- reports that he has been soakingin tub. Flared up last week. Reports that he was lifting heavy at the gym. Declines examination of hemorrhoids today.      Review of Systems See HPI  Past Medical History  Diagnosis Date  . ADD (attention deficit disorder)   . Tobacco abuse   . Hyperlipemia     History   Social History  . Marital Status: Married    Spouse Name: N/A    Number of Children: N/A  . Years of Education: N/A   Occupational History  . Not on file.   Social History Main Topics  . Smoking status: Current Every Day Smoker -- 0.50 packs/day  . Smokeless tobacco: Never Used     Comment: <1/2 pack  per day  . Alcohol Use: No  . Drug Use: 7.00 per week    Special: Marijuana  . Sexual Activity: Yes   Other Topics Concern  . Not on file   Social History Narrative   Works for [employer]   + tobacco abuse   2 children- daughter age 30 and daughter age 51   Married   Grew up in [place], moved here at age 84   Completed high school   Enjoys outdoor activities   Working on Restaurant manager, fast food          Past Surgical History  Procedure Laterality Date  . No past  surgeries      denies surgical history    Family History  Problem Relation Age of Onset  . Hypertension Mother   . Healthy Father   . Healthy Brother   . Sudden death Neg Hx   . Hyperlipidemia Neg Hx   . Heart attack Neg Hx   . Diabetes Neg Hx     Allergies  Allergen Reactions  . Bee Venom Shortness Of Breath    Yellow Jacket  . Penicillins     Mother allergic to it. Pt never taken it.    Current Outpatient Prescriptions on File Prior to Visit  Medication Sig Dispense Refill  . amphetamine-dextroamphetamine (ADDERALL) 20 MG tablet Take 1 tablet (20 mg total) by mouth 2 (two) times daily.  60 tablet  0  . EPINEPHrine 0.3 mg/0.3 mL IJ SOAJ injection Inject 0.3 mLs (0.3 mg total) into the muscle once.  2 Device  1   No current facility-administered medications on file prior to visit.    BP 120/86  Pulse 112  Temp(Src) 98.5 F (36.9 C) (Oral)  Resp 18  Ht 6' (1.829 m)  Wt 224 lb 1.3 oz (101.642 kg)  BMI 30.38 kg/m2  SpO2 99%       Objective:   Physical Exam  Constitutional: He is oriented to person, place, and time. He appears well-developed and well-nourished. No distress.  Cardiovascular: Normal rate and regular rhythm.   No murmur heard. Pulmonary/Chest: Effort normal and breath sounds normal. No respiratory distress. He has no wheezes. He has no rales. He exhibits no tenderness.  Musculoskeletal: He exhibits no edema.  Tenderness to palpation overlying the right lateral rib cage. R knee in brace.   Neurological: He is alert and oriented to person, place, and time.  Psychiatric: He has a normal mood and affect. His behavior is normal. Judgment and thought content normal.          Assessment & Plan:

## 2013-09-07 NOTE — Patient Instructions (Signed)
You will be contacted about your sleep study. We will contact you with your sleep study results. You may use aleve 220mg  (one tab) twice daily as needed for right rib pain.   You may use proctofoam as needed for hemorrhoids. Continue daily tub soaks- you can sprinkle epsom salts in tub prior to soak. Drink lots of water and eat a high fiber diet to help with constipation. Avoid strenuous lifting to prevent flare up of your hemmorhoids. Call if symptoms worsen or if symptoms do not improve.

## 2013-09-07 NOTE — Telephone Encounter (Signed)
Relevant patient education mailed to patient.  

## 2013-09-07 NOTE — Progress Notes (Signed)
Pre visit review using our clinic review tool, if applicable. No additional management support is needed unless otherwise documented below in the visit note. 

## 2013-09-10 DIAGNOSIS — G4733 Obstructive sleep apnea (adult) (pediatric): Secondary | ICD-10-CM

## 2013-09-10 DIAGNOSIS — M7918 Myalgia, other site: Secondary | ICD-10-CM | POA: Insufficient documentation

## 2013-09-10 DIAGNOSIS — K649 Unspecified hemorrhoids: Secondary | ICD-10-CM | POA: Insufficient documentation

## 2013-09-10 HISTORY — DX: Obstructive sleep apnea (adult) (pediatric): G47.33

## 2013-09-10 NOTE — Assessment & Plan Note (Signed)
You may use proctofoam as needed for hemorrhoids. Continue daily tub soaks- you can sprinkle epsom salts in tub prior to soak. Drink lots of water and eat a high fiber diet to help with constipation. Avoid strenuous lifting to prevent flare up of your hemmorhoids. Call if symptoms worsen or if symptoms do not improve.

## 2013-09-10 NOTE — Assessment & Plan Note (Signed)
Witnessed apneas/loud snoring. Advised pt that we should have him complete sleep study prior to surgical clearanc.e

## 2013-09-10 NOTE — Assessment & Plan Note (Signed)
Recommend trial of aleve.

## 2013-09-29 ENCOUNTER — Telehealth: Payer: Self-pay | Admitting: Family

## 2013-09-29 DIAGNOSIS — F988 Other specified behavioral and emotional disorders with onset usually occurring in childhood and adolescence: Secondary | ICD-10-CM

## 2013-09-29 MED ORDER — AMPHETAMINE-DEXTROAMPHETAMINE 20 MG PO TABS: 20.0000 mg | ORAL_TABLET | Freq: Two times a day (BID) | ORAL | Status: DC

## 2013-09-29 NOTE — Telephone Encounter (Signed)
Sleep ctr has been trying to call pt to schedule study, no return call. He may call [facility] sleep [facility] [phone]

## 2013-09-29 NOTE — Telephone Encounter (Signed)
Notified pt rx ready for pick up. Gave pt # to call sleep center.

## 2013-09-29 NOTE — Telephone Encounter (Signed)
Last adderall Rx printed 08/26/13. Rx printed and forwarded to [facility] for signature.    Delsa Sale-- please advise re: split night sleep study ordered on 09/07/13.

## 2013-09-29 NOTE — Telephone Encounter (Signed)
Pt came in today and requested refill on adderall.  Also he wanted to check the status of the referral for a sleep study.  Please advise.

## 2013-10-19 ENCOUNTER — Ambulatory Visit: Payer: BC Managed Care – PPO | Admitting: Family

## 2013-10-28 ENCOUNTER — Telehealth: Payer: Self-pay | Admitting: Family

## 2013-10-28 DIAGNOSIS — F988 Other specified behavioral and emotional disorders with onset usually occurring in childhood and adolescence: Secondary | ICD-10-CM

## 2013-10-28 MED ORDER — AMPHETAMINE-DEXTROAMPHETAMINE 20 MG PO TABS: 20.0000 mg | ORAL_TABLET | Freq: Two times a day (BID) | ORAL | Status: DC

## 2013-10-28 NOTE — Telephone Encounter (Signed)
Rx signed and sent to front for pick up. Pt notified.

## 2013-10-28 NOTE — Telephone Encounter (Signed)
Rx printed and forwarded to Provider for signature.

## 2013-10-28 NOTE — Telephone Encounter (Signed)
Caller name: [name] Relation to pt: Call back [phone]   Reason for call:  Pt would like a refill on Rx amphetamine-dextroamphetamine (ADDERALL) 20 MG tablet.  Call when completed.

## 2013-11-23 ENCOUNTER — Ambulatory Visit (HOSPITAL_BASED_OUTPATIENT_CLINIC_OR_DEPARTMENT_OTHER): Payer: BC Managed Care – PPO | Attending: Family | Admitting: Radiology

## 2013-11-23 VITALS — Ht 72.0 in | Wt 220.0 lb

## 2013-11-23 DIAGNOSIS — R0683 Snoring: Secondary | ICD-10-CM | POA: Diagnosis present

## 2013-11-23 DIAGNOSIS — R400 Somnolence: Secondary | ICD-10-CM

## 2013-11-23 DIAGNOSIS — G4733 Obstructive sleep apnea (adult) (pediatric): Secondary | ICD-10-CM | POA: Diagnosis not present

## 2013-11-23 DIAGNOSIS — G4730 Sleep apnea, unspecified: Secondary | ICD-10-CM | POA: Diagnosis present

## 2013-11-30 ENCOUNTER — Telehealth: Payer: Self-pay | Admitting: Pulmonary Disease

## 2013-11-30 ENCOUNTER — Telehealth: Payer: Self-pay | Admitting: *Deleted

## 2013-11-30 DIAGNOSIS — G4733 Obstructive sleep apnea (adult) (pediatric): Secondary | ICD-10-CM

## 2013-11-30 DIAGNOSIS — F988 Other specified behavioral and emotional disorders with onset usually occurring in childhood and adolescence: Secondary | ICD-10-CM

## 2013-11-30 MED ORDER — AMPHETAMINE-DEXTROAMPHETAMINE 20 MG PO TABS: 20.0000 mg | ORAL_TABLET | Freq: Two times a day (BID) | ORAL | Status: DC

## 2013-11-30 NOTE — Sleep Study (Signed)
[place]  NAME: Joel [name]  DATE OF BIRTH: 1985-05-10  MEDICAL RECORD NUMBER [id number]  LOCATION: [facility]  PHYSICIAN: ALVA,RAKESH V.  DATE OF STUDY: 11/23/13  SLEEP STUDY TYPE: Split Polysomnogram               REFERRING PHYSICIAN: Rigoberto Noel, MD  INDICATION FOR STUDY: 28 y.o with loud snoring ,witnessed apneas who "failed" the preop sleep apnea questionairre . At the time of this study ,they weighed 220 pounds with a height of  6 ft 0 inches and the BMI of 30, neck size of 17 inches. Epworth sleepiness score was 16   This intervention polysomnogram was performed with a sleep technologist in attendance. EEG, EOG,EMG and respiratory parameters recorded. Sleep stages, arousals, limb movements and respiratory data was scored according to criteria laid out by the American Academy of sleep medicine.  SLEEP ARCHITECTURE: Lights out was at 2255 PM and lights on was at 0543 AM. During the baseline portion ,total sleep time was 155 minutes with sleep latency of 12 minutes with latency to REM sleep of 108 minutes and wake after sleep onset of 12 minutes. Sleep efficiency was poor at 87% with frequent long periods of awakening. Sleep stages as a percentage of total sleep time was N1 -20%,N2- 58% and REM sleep 22% ( 34 minutes) . During titration REM sleep accounted for 45 minutes and supine sleep was noted .The longest period of REM sleep was around 0430 AM.   AROUSAL DATA : At baseline ,there were 70 arousals with an arousal index of 27 events per hour. Of these 44 were spontaneous, and 26 were associated with respiratory events and 0 were associated periodic limb movements. During titration, the arousal index was 13  events per hour  RESPIRATORY DATA: During the baseline portion,there were 3 obstructive apneas, 0 central apneas, 0 mixed apneas and 41 hypopneas with apnea -hypopnea index of 17 events per hour.  There was no relation to sleep  stage or body position. Due to this degree of respiratory disturbance CPAP was initiated at 5 cm and titrated to find a level of 12 cm due to persistent events during supine sleep. At the level of 12 cm for 44 minutes of sleep, 1 apneas and 0 hypopneas is were noted. Titration was optimal .  MOVEMENT/PARASOMNIA: There were 0 PLMS with a PLM index of 0 events per hour. The PLM arousal index was 0 events per hour. PLMS seemed to improve during titration.  OXYGEN DATA: The lowest desaturation was 89% during non-REM sleep and the desaturation index was 17 per hour. This was corrected by titration and the saturations stayed below 88% for 0 minutes during titration.  CARDIAC DATA: The low heart rate was 44 beats per minute. The high heart rate recorded was an artifact. No arrhythmias were noted   IMPRESSION :  1. Moderate obstructive sleep apnea with hypopneas causing sleep fragmentation and mild oxygen desaturation. 2. This was corrected by CPAP of 12 centimeters with large full face mask. Titration was optimal 3. No evidence of cardiac arrhythmias or behavioral disturbance during sleep. Periodic limb movements were not noted.  RECOMMENDATION:    1. The treatment options for this degree of sleep disordered breathing includes weight loss and CPAP therapy. 2. CPAP can be initiated at 12 centimeters with a large full face mask and compliance monitored at this level. 3. Patient should be cautioned against driving when sleepy.They should be asked to avoid  medications with sedative side effects  Rigoberto Noel MD Diplomate, American Board of Sleep Medicine  ELECTRONICALLY SIGNED ON: 11/30/2013  [facility] PH: [phone]   FX: [phone] [place]

## 2013-11-30 NOTE — Telephone Encounter (Signed)
Last Rx printed 10/28/13. Pt was due for 6 month follow up in 09/2013. Rx printed and forwarded to Provider signature. Pt will need f/u before further refills can be given.

## 2013-11-30 NOTE — Telephone Encounter (Signed)
Mod OSA requiring CPAP 12 cm, large full face mask

## 2013-11-30 NOTE — Telephone Encounter (Signed)
Caller name: Woodruff Relation to pt: self Call back number: [phone] Pharmacy:   Reason for call: Pt came in office requesting refill on  h  amphetamine-dextroamphetamine (ADDERALL) 20 MG tablet   Last written 10/28/2013, #60, no refills tamine (ADDERALL) 20 MG tablet  Last OV 09/07/2013 amphetamine-dextroamphetamine (ADDERALL) 20 MG tablet   amphetamine-dextroamphetamine (ADDERALL) 20 MG tablet

## 2013-12-01 ENCOUNTER — Encounter: Payer: Self-pay | Admitting: Pulmonary Disease

## 2013-12-01 NOTE — Telephone Encounter (Signed)
Attempted to reach pt, "wireless customer not available". Will try again later.

## 2013-12-01 NOTE — Telephone Encounter (Signed)
Please let pt know that sleep study confirms sleep apnea.  I will arrange home CPAP, (pended below).

## 2013-12-01 NOTE — Telephone Encounter (Signed)
Rx signed. Attempted to reach pt and received recording that "wireless customer is not available".

## 2013-12-01 NOTE — Telephone Encounter (Signed)
Pt picked up Rx today.

## 2013-12-01 NOTE — Telephone Encounter (Signed)
Attempted to reach pt and unable to leave message on voicemail .

## 2013-12-03 NOTE — Telephone Encounter (Signed)
Pt returning your call back

## 2013-12-04 NOTE — Telephone Encounter (Signed)
Attempted to reach pt, wireless customer not available.

## 2013-12-04 NOTE — Telephone Encounter (Signed)
Attempted to reach pt, "wireless customer not available".

## 2013-12-04 NOTE — Telephone Encounter (Signed)
Notified pt and he voices understanding. 

## 2013-12-28 NOTE — Telephone Encounter (Signed)
Received order for CPAP supplies from [facility]. Order forwarded to [facility]. [id number]//[id number]

## 2013-12-29 HISTORY — PX: KNEE SURGERY: SHX244

## 2014-01-01 ENCOUNTER — Telehealth: Payer: Self-pay | Admitting: Family

## 2014-01-01 DIAGNOSIS — F988 Other specified behavioral and emotional disorders with onset usually occurring in childhood and adolescence: Secondary | ICD-10-CM

## 2014-01-01 MED ORDER — AMPHETAMINE-DEXTROAMPHETAMINE 20 MG PO TABS: 20.0000 mg | ORAL_TABLET | Freq: Two times a day (BID) | ORAL | Status: DC

## 2014-01-01 NOTE — Telephone Encounter (Signed)
6 month follow up please

## 2014-01-01 NOTE — Telephone Encounter (Signed)
Rx placed at front desk and pt notified. 

## 2014-01-01 NOTE — Telephone Encounter (Signed)
adderall 20 mg

## 2014-01-01 NOTE — Telephone Encounter (Signed)
Last Rx printed 11/30/13.  Rx printed and forwarded to [facility] for signature.  Pt was last seen by PCP in 08/2013 and has no future appts scheduled. When will pt need to follow up in the office?

## 2014-01-06 NOTE — Telephone Encounter (Signed)
Order faxed. [id number]//[id number]

## 2014-02-01 ENCOUNTER — Telehealth: Payer: Self-pay | Admitting: *Deleted

## 2014-02-01 DIAGNOSIS — F988 Other specified behavioral and emotional disorders with onset usually occurring in childhood and adolescence: Secondary | ICD-10-CM

## 2014-02-01 MED ORDER — AMPHETAMINE-DEXTROAMPHETAMINE 20 MG PO TABS: 20.0000 mg | ORAL_TABLET | Freq: Two times a day (BID) | ORAL | Status: DC

## 2014-02-01 NOTE — Telephone Encounter (Signed)
Rx printed and forwarded to [facility] for signature.  Pt is due for 6 month follow up in February.

## 2014-02-01 NOTE — Telephone Encounter (Signed)
Caller name: Hoover Relation to pt: self Call back number: [phone] Pharmacy: [facility]  Reason for call:  Pt came by office requesting refill on  amphetamine-dextroamphetamine (ADDERALL) 20 MG tablet Last written 01/01/14, #60, no refills Last OV 09/07/13

## 2014-02-02 NOTE — Telephone Encounter (Signed)
Rx placed at front desk and detailed message left on home #.

## 2014-02-09 ENCOUNTER — Telehealth: Payer: Self-pay | Admitting: *Deleted

## 2014-02-09 NOTE — Telephone Encounter (Signed)
Received physician order from [place] for CPAP settings. Order sent to Provider for completion / signature.

## 2014-02-09 NOTE — Telephone Encounter (Signed)
Signed.

## 2014-02-09 NOTE — Telephone Encounter (Signed)
Minimum and maximum cmH20 missing on form. Sent back to Provider. Please advise.

## 2014-02-10 NOTE — Telephone Encounter (Signed)
Signed order faxed to [phone].

## 2014-02-10 NOTE — Telephone Encounter (Signed)
Since we are not using auto CPAP, the CPAP setting of 12 cmH20 should be sufficient. Thanks.

## 2014-03-04 ENCOUNTER — Telehealth: Payer: Self-pay | Admitting: Family

## 2014-03-04 DIAGNOSIS — F988 Other specified behavioral and emotional disorders with onset usually occurring in childhood and adolescence: Secondary | ICD-10-CM

## 2014-03-04 NOTE — Telephone Encounter (Signed)
Written rx for adderall 20 mg

## 2014-03-04 NOTE — Telephone Encounter (Signed)
Last filled: 02/01/14 Amt: 60, 0  Last OV: 09/07/13  Please advise.

## 2014-03-05 MED ORDER — AMPHETAMINE-DEXTROAMPHETAMINE 20 MG PO TABS: 20.0000 mg | ORAL_TABLET | Freq: Two times a day (BID) | ORAL | Status: DC

## 2014-03-05 NOTE — Telephone Encounter (Signed)
Rx placed at front desk for pick up and pt notified.

## 2014-04-01 ENCOUNTER — Telehealth: Payer: Self-pay | Admitting: Family

## 2014-04-01 DIAGNOSIS — F988 Other specified behavioral and emotional disorders with onset usually occurring in childhood and adolescence: Secondary | ICD-10-CM

## 2014-04-01 MED ORDER — AMPHETAMINE-DEXTROAMPHETAMINE 20 MG PO TABS: 20.0000 mg | ORAL_TABLET | Freq: Two times a day (BID) | ORAL | Status: DC

## 2014-04-01 NOTE — Telephone Encounter (Signed)
See rx. 

## 2014-04-01 NOTE — Telephone Encounter (Signed)
Last filled:  03/05/14 Amt: 60, 0  Last OV:  09/07/13  Please advise.

## 2014-04-01 NOTE — Telephone Encounter (Signed)
Caller name:Borneman, [name] Relation to YE:BXID Call back [place]:  Reason for call: pt is needing rx amphetamine-dextroamphetamine (ADDERALL) 20 MG tablet , please call when available for pick up,.

## 2014-04-02 NOTE — Telephone Encounter (Signed)
Rx placed at front desk and pt has been notified.

## 2014-05-03 ENCOUNTER — Telehealth: Payer: Self-pay | Admitting: Family

## 2014-05-03 DIAGNOSIS — F988 Other specified behavioral and emotional disorders with onset usually occurring in childhood and adolescence: Secondary | ICD-10-CM

## 2014-05-03 MED ORDER — AMPHETAMINE-DEXTROAMPHETAMINE 20 MG PO TABS: 20.0000 mg | ORAL_TABLET | Freq: Two times a day (BID) | ORAL | Status: DC

## 2014-05-03 NOTE — Telephone Encounter (Signed)
Rx printed and forwarded to Provider for signatuer. Pt was due for 6 month f/u in February and will need to be seen before further refills are given.

## 2014-05-03 NOTE — Telephone Encounter (Signed)
Caller name: Attilio Relation to pt: self Call back number: [phone] Pharmacy:  Reason for call:   Requesting adderall rx

## 2014-05-04 NOTE — Telephone Encounter (Signed)
Rx placed at front desk for pick up. Left message for pt to return my call. When he calls back please schedule follow up within 30 days before next refill is due.

## 2014-05-05 NOTE — Telephone Encounter (Signed)
Pt scheduled appt for 05/12/14 with PCP.

## 2014-05-12 ENCOUNTER — Ambulatory Visit (INDEPENDENT_AMBULATORY_CARE_PROVIDER_SITE_OTHER): Payer: BLUE CROSS/BLUE SHIELD | Admitting: Family

## 2014-05-12 ENCOUNTER — Encounter: Payer: Self-pay | Admitting: Family

## 2014-05-12 VITALS — BP 124/82 | HR 78 | Temp 98.0°F | Resp 16 | Ht 72.0 in | Wt 230.8 lb

## 2014-05-12 DIAGNOSIS — G4733 Obstructive sleep apnea (adult) (pediatric): Secondary | ICD-10-CM | POA: Diagnosis not present

## 2014-05-12 DIAGNOSIS — F988 Other specified behavioral and emotional disorders with onset usually occurring in childhood and adolescence: Secondary | ICD-10-CM

## 2014-05-12 DIAGNOSIS — K649 Unspecified hemorrhoids: Secondary | ICD-10-CM | POA: Diagnosis not present

## 2014-05-12 DIAGNOSIS — F909 Attention-deficit hyperactivity disorder, unspecified type: Secondary | ICD-10-CM | POA: Diagnosis not present

## 2014-05-12 DIAGNOSIS — E785 Hyperlipidemia, unspecified: Secondary | ICD-10-CM

## 2014-05-12 NOTE — Patient Instructions (Addendum)
Please schedule a fasting physical at the front desk. Complete urine test (UDS) in the lab.

## 2014-05-12 NOTE — Progress Notes (Signed)
Subjective:    Patient ID: Joel [name], male    DOB: October 22, 1985, 29 y.o.   MRN: [id number]  HPI  Pt presents today for follow up  1)  ADD- maintained on Adderall.  Maintenance production control.  Has a lot of classes and training and reports that adderall continues to help.    2) Hyperlipidemia-  Reports that he has been eating an improved diet, eating more chicken an fish.  Lab Results  Component Value Date   CHOL 214* 04/14/2013   HDL 42 04/14/2013   LDLCALC 148* 04/14/2013   TRIG 122 04/14/2013   CHOLHDL 5.1 04/14/2013   Wt Readings from Last 3 Encounters:  05/12/14 230 lb 12.8 oz (104.69 kg)  11/23/13 220 lb (99.791 kg)  09/07/13 224 lb 1.3 oz (101.642 kg)   3) hemorrhoids- improved since he has increased fiber in his diet.    4) OSA- pt reports that he was intolerant to nasal pillows or full face mask. He is agreeable to trial of an oral appliance.  Review of Systems See HPI  Past Medical History  Diagnosis Date  . ADD (attention deficit disorder)   . Tobacco abuse   . Hyperlipemia   . Obstructive sleep apnea 09/10/2013    OSA per split night study 10/15, settings 12 cm h20 full face mask.       History   Social History  . Marital Status: Married    Spouse Name: N/A  . Number of Children: N/A  . Years of Education: N/A   Occupational History  . Not on file.   Social History Main Topics  . Smoking status: Current Every Day Smoker -- 0.50 packs/day  . Smokeless tobacco: Never Used     Comment: <1/2 pack  per day  . Alcohol Use: No  . Drug Use: 7.00 per week    Special: Marijuana  . Sexual Activity: Yes   Other Topics Concern  . Not on file   Social History Narrative   Works for [employer]   + tobacco abuse   2 children- daughter age 97 and daughter age 17   Married   Grew up in [place], moved here at age 40   Completed high school   Enjoys outdoor activities   Working on Restaurant manager, fast food           Past Surgical History  Procedure Laterality Date  . No past surgeries      denies surgical history    Family History  Problem Relation Age of Onset  . Hypertension Mother   . Healthy Father   . Healthy Brother   . Sudden death Neg Hx   . Hyperlipidemia Neg Hx   . Heart attack Neg Hx   . Diabetes Neg Hx     Allergies  Allergen Reactions  . Bee Venom Shortness Of Breath    Yellow Jacket  . Penicillins     Mother allergic to it. Pt never taken it.    Current Outpatient Prescriptions on File Prior to Visit  Medication Sig Dispense Refill  . amphetamine-dextroamphetamine (ADDERALL) 20 MG tablet Take 1 tablet (20 mg total) by mouth 2 (two) times daily. 60 tablet 0  . EPINEPHrine 0.3 mg/0.3 mL IJ SOAJ injection Inject 0.3 mLs (0.3 mg total) into the muscle once. 2 Device 1  . hydrocortisone-pramoxine (PROCTOFOAM HC) rectal foam Place 1 applicator rectally 2 (two) times daily. 10 g 2   No current facility-administered medications on file prior  to visit.    BP 124/82 mmHg  Pulse 78  Temp(Src) 98 F (36.7 C) (Oral)  Resp 16  Ht 6' (1.829 m)  Wt 230 lb 12.8 oz (104.69 kg)  BMI 31.30 kg/m2  SpO2 99%       Objective:   Physical Exam  Constitutional: He is oriented to person, place, and time. He appears well-developed and well-nourished. No distress.  HENT:  Head: Normocephalic and atraumatic.  Cardiovascular: Normal rate and regular rhythm.   No murmur heard. Pulmonary/Chest: Effort normal and breath sounds normal. No respiratory distress. He has no wheezes. He has no rales.  Musculoskeletal: He exhibits no edema.  Neurological: He is alert and oriented to person, place, and time.  Skin: Skin is warm and dry.  Psychiatric: He has a normal mood and affect. His behavior is normal. Thought content normal.          Assessment & Plan:

## 2014-05-12 NOTE — Progress Notes (Signed)
Pre visit review using our clinic review tool, if applicable. No additional management support is needed unless otherwise documented below in the visit note. 

## 2014-05-13 NOTE — Assessment & Plan Note (Signed)
Intolerant to cpap.  He is agreeable to trial of oral appliance. Will refer to orthodontics.

## 2014-05-13 NOTE — Assessment & Plan Note (Signed)
Continue low fat/low cholesterol diet

## 2014-05-13 NOTE — Assessment & Plan Note (Signed)
Improved since he has increased the fiber in his diet.

## 2014-05-13 NOTE — Assessment & Plan Note (Signed)
Stable on adderall, continue same.  

## 2014-05-18 ENCOUNTER — Telehealth: Payer: Self-pay | Admitting: Family

## 2014-05-18 NOTE — Telephone Encounter (Signed)
Pre Visit letter sent  °

## 2014-06-01 ENCOUNTER — Telehealth: Payer: Self-pay | Admitting: Family

## 2014-06-01 DIAGNOSIS — F988 Other specified behavioral and emotional disorders with onset usually occurring in childhood and adolescence: Secondary | ICD-10-CM

## 2014-06-01 MED ORDER — AMPHETAMINE-DEXTROAMPHETAMINE 20 MG PO TABS: 20.0000 mg | ORAL_TABLET | Freq: Two times a day (BID) | ORAL | Status: DC

## 2014-06-01 NOTE — Telephone Encounter (Signed)
Caller name: Shail Relation to pt: self Call back number: [phone]  Pharmacy:  Reason for call:   Requesting adderall refill

## 2014-06-01 NOTE — Telephone Encounter (Signed)
Last rx 05/03/14, [place] signed. Pt has f/u 06/07/14  Rx printed and forwarded to PRovider for signature.

## 2014-06-02 NOTE — Telephone Encounter (Signed)
Rx placed at front desk for pick up and detailed message left on voicemail.

## 2014-06-07 ENCOUNTER — Encounter: Payer: BLUE CROSS/BLUE SHIELD | Admitting: Family

## 2014-06-08 ENCOUNTER — Telehealth: Payer: Self-pay | Admitting: Family

## 2014-06-08 NOTE — Telephone Encounter (Signed)
Pre Visit letter sent  °

## 2014-06-29 ENCOUNTER — Encounter: Payer: BLUE CROSS/BLUE SHIELD | Admitting: Family

## 2014-07-01 ENCOUNTER — Telehealth: Payer: Self-pay | Admitting: Family

## 2014-07-01 DIAGNOSIS — F988 Other specified behavioral and emotional disorders with onset usually occurring in childhood and adolescence: Secondary | ICD-10-CM

## 2014-07-01 MED ORDER — AMPHETAMINE-DEXTROAMPHETAMINE 20 MG PO TABS: 20.0000 mg | ORAL_TABLET | Freq: Two times a day (BID) | ORAL | Status: DC

## 2014-07-01 NOTE — Telephone Encounter (Signed)
No answer at work #. Left detailed message on home # and Rx placed at front desk for pick up.

## 2014-07-01 NOTE — Telephone Encounter (Signed)
UDS (moderate) and CSC 04/2014. Rx printed and forwarded to Provider for signature.

## 2014-07-01 NOTE — Telephone Encounter (Signed)
Relation to [id number] Call back number: best # work [phone]   Reason for call:  Pt requesting a refill amphetamine-dextroamphetamine (ADDERALL) 20 MG tablet

## 2014-08-03 ENCOUNTER — Telehealth: Payer: Self-pay | Admitting: Family

## 2014-08-03 DIAGNOSIS — F988 Other specified behavioral and emotional disorders with onset usually occurring in childhood and adolescence: Secondary | ICD-10-CM

## 2014-08-03 MED ORDER — AMPHETAMINE-DEXTROAMPHETAMINE 20 MG PO TABS: 20.0000 mg | ORAL_TABLET | Freq: Two times a day (BID) | ORAL | Status: DC

## 2014-08-03 NOTE — Telephone Encounter (Signed)
Rx signed and placed at front desk for pick up.  Notified pt.

## 2014-08-03 NOTE — Telephone Encounter (Signed)
Caller name: Jakell Morand Relationship to patient: self Can be reached:[phone]  Reason for call: Pt calling for refill on adderall. Has 6 left. Taking 2/day. (3 days on hand). Please notify pt when ready for pick up.

## 2014-08-03 NOTE — Telephone Encounter (Signed)
Last OV 05/12/14, pt signed CSC and gave UDS. Pt due for fasting physical.  Last Rx 07/01/14, #60.  Rx printed and forwarded to Provider for signature.

## 2014-09-09 ENCOUNTER — Telehealth: Payer: Self-pay | Admitting: Family

## 2014-09-09 DIAGNOSIS — F988 Other specified behavioral and emotional disorders with onset usually occurring in childhood and adolescence: Secondary | ICD-10-CM

## 2014-09-09 NOTE — Telephone Encounter (Signed)
Relation to [id number] Call back number: [phone]   Reason for call:  Patient requesting a refill amphetamine-dextroamphetamine (ADDERALL) 20 MG tablet

## 2014-09-10 MED ORDER — AMPHETAMINE-DEXTROAMPHETAMINE 20 MG PO TABS: 20.0000 mg | ORAL_TABLET | Freq: Two times a day (BID) | ORAL | Status: DC

## 2014-09-10 NOTE — Telephone Encounter (Signed)
Rx left at front desk. Pt notified.

## 2014-09-10 NOTE — Telephone Encounter (Signed)
Requesting: Adderall Contract  05/12/14 UDS  Moderate 05/12/14 Last OV  05/12/14 Last Refill  #60 08/03/14  Please Advise

## 2014-10-11 ENCOUNTER — Telehealth: Payer: Self-pay | Admitting: Family

## 2014-10-11 DIAGNOSIS — F988 Other specified behavioral and emotional disorders with onset usually occurring in childhood and adolescence: Secondary | ICD-10-CM

## 2014-10-11 MED ORDER — AMPHETAMINE-DEXTROAMPHETAMINE 20 MG PO TABS: 20.0000 mg | ORAL_TABLET | Freq: Two times a day (BID) | ORAL | Status: DC

## 2014-10-11 NOTE — Telephone Encounter (Signed)
Pt last OV 04/2014 and advised to schedule CPE. Last UDS 04/2014 and due for repeat now. Last Rx, 09/10/14. Rx printed and forwarded to PCP for signature. Will need to obtain UDS at time of Rx pick up. Rx signed placed at front desk for pick up and pt has been made aware.

## 2014-10-11 NOTE — Telephone Encounter (Signed)
Relation to pt:self °Call back number: [phone] ° ° °Reason for call:  °Patient requesting a refill amphetamine-dextroamphetamine (ADDERALL) 20 MG tablet  °

## 2014-11-11 ENCOUNTER — Telehealth: Payer: Self-pay | Admitting: Family

## 2014-11-11 DIAGNOSIS — F988 Other specified behavioral and emotional disorders with onset usually occurring in childhood and adolescence: Secondary | ICD-10-CM

## 2014-11-11 MED ORDER — AMPHETAMINE-DEXTROAMPHETAMINE 20 MG PO TABS: 20.0000 mg | ORAL_TABLET | Freq: Two times a day (BID) | ORAL | Status: DC

## 2014-11-11 NOTE — Telephone Encounter (Signed)
Last filled: 10/11/14 Amt: 60, 0 Last OV: 05/12/14  Please advise.

## 2014-11-11 NOTE — Telephone Encounter (Signed)
Pt needing refill on adderall. Has 4 left. Taking 2/day. Please call when RX is ready for pick up @ [phone].

## 2014-11-12 NOTE — Telephone Encounter (Signed)
Rx printed, signed and placed at front desk for pick up. Notified pt and scheduled him for physical on 12/15/14 at 1:15pm per 04/2014 office note.

## 2014-12-15 ENCOUNTER — Encounter: Payer: BLUE CROSS/BLUE SHIELD | Admitting: Family

## 2014-12-15 ENCOUNTER — Telehealth: Payer: Self-pay | Admitting: Family

## 2014-12-15 NOTE — Telephone Encounter (Signed)
Patient had an unexpected call from child school and had to pick his daughter up therefore had to cancel his physical appointment for today. Charge or no charge

## 2014-12-16 NOTE — Telephone Encounter (Signed)
No charge. 

## 2014-12-17 ENCOUNTER — Telehealth: Payer: Self-pay | Admitting: Family

## 2014-12-17 DIAGNOSIS — F988 Other specified behavioral and emotional disorders with onset usually occurring in childhood and adolescence: Secondary | ICD-10-CM

## 2014-12-17 MED ORDER — AMPHETAMINE-DEXTROAMPHETAMINE 20 MG PO TABS: 20.0000 mg | ORAL_TABLET | Freq: Two times a day (BID) | ORAL | Status: DC

## 2014-12-17 NOTE — Telephone Encounter (Signed)
Rx printed and forwarded to PCP for signature. 

## 2014-12-17 NOTE — Telephone Encounter (Signed)
Can be reached: [phone]  Reason for call: Pt missed CPE due to emergency with his daughter. He is out of adderall and asking for RX. Please call him when ready or if problem. He is rescheduled for 01/17/15.

## 2014-12-17 NOTE — Telephone Encounter (Signed)
Rx placed at front desk for pick up and pt has been notified. 

## 2015-01-14 ENCOUNTER — Encounter: Payer: Self-pay | Admitting: Behavioral Health

## 2015-01-14 ENCOUNTER — Telehealth: Payer: Self-pay | Admitting: Behavioral Health

## 2015-01-14 NOTE — Telephone Encounter (Signed)
Pre-Visit Call completed with patient and chart updated.   Pre-Visit Info documented in Specialty Comments under [employer].    

## 2015-01-17 ENCOUNTER — Encounter: Payer: Self-pay | Admitting: Family

## 2015-01-17 ENCOUNTER — Other Ambulatory Visit: Payer: Self-pay | Admitting: Family

## 2015-01-17 ENCOUNTER — Ambulatory Visit (INDEPENDENT_AMBULATORY_CARE_PROVIDER_SITE_OTHER): Payer: BLUE CROSS/BLUE SHIELD | Admitting: Family

## 2015-01-17 VITALS — BP 128/82 | HR 92 | Temp 97.9°F | Resp 16 | Ht 72.0 in | Wt 225.0 lb

## 2015-01-17 DIAGNOSIS — Z0000 Encounter for general adult medical examination without abnormal findings: Secondary | ICD-10-CM | POA: Diagnosis not present

## 2015-01-17 DIAGNOSIS — G4733 Obstructive sleep apnea (adult) (pediatric): Secondary | ICD-10-CM

## 2015-01-17 DIAGNOSIS — F909 Attention-deficit hyperactivity disorder, unspecified type: Secondary | ICD-10-CM | POA: Diagnosis not present

## 2015-01-17 DIAGNOSIS — F988 Other specified behavioral and emotional disorders with onset usually occurring in childhood and adolescence: Secondary | ICD-10-CM

## 2015-01-17 LAB — CBC WITH DIFFERENTIAL/PLATELET
Basophils Absolute: 0 10*3/uL (ref 0.0–0.1)
Basophils Relative: 0.5 % (ref 0.0–3.0)
Eosinophils Absolute: 0.1 10*3/uL (ref 0.0–0.7)
Eosinophils Relative: 0.7 % (ref 0.0–5.0)
HCT: 48.7 % (ref 39.0–52.0)
Hemoglobin: 16.4 g/dL (ref 13.0–17.0)
Lymphocytes Relative: 36.9 % (ref 12.0–46.0)
Lymphs Abs: 3.5 10*3/uL (ref 0.7–4.0)
MCHC: 33.6 g/dL (ref 30.0–36.0)
MCV: 96.7 fl (ref 78.0–100.0)
Monocytes Absolute: 0.7 10*3/uL (ref 0.1–1.0)
Monocytes Relative: 6.9 % (ref 3.0–12.0)
Neutro Abs: 5.2 10*3/uL (ref 1.4–7.7)
Neutrophils Relative %: 55 % (ref 43.0–77.0)
Platelets: 347 10*3/uL (ref 150.0–400.0)
RBC: 5.04 Mil/uL (ref 4.22–5.81)
RDW: 12.7 % (ref 11.5–15.5)
WBC: 9.5 10*3/uL (ref 4.0–10.5)

## 2015-01-17 LAB — BASIC METABOLIC PANEL
BUN: 13 mg/dL (ref 6–23)
CO2: 26 mEq/L (ref 19–32)
Calcium: 9.4 mg/dL (ref 8.4–10.5)
Chloride: 106 mEq/L (ref 96–112)
Creatinine, Ser: 1 mg/dL (ref 0.40–1.50)
GFR: 92.13 mL/min (ref >60.00)
Glucose, Bld: 97 mg/dL (ref 70–99)
Potassium: 4.1 mEq/L (ref 3.5–5.1)
Sodium: 139 mEq/L (ref 135–145)

## 2015-01-17 LAB — URINALYSIS, ROUTINE W REFLEX MICROSCOPIC
Bilirubin Urine: NEGATIVE
Hgb urine dipstick: NEGATIVE
Ketones, ur: NEGATIVE
Leukocytes, UA: NEGATIVE
Nitrite: NEGATIVE
RBC / HPF: NONE SEEN (ref 0–?)
Specific Gravity, Urine: 1.015 (ref 1.000–1.030)
Total Protein, Urine: NEGATIVE
Urine Glucose: NEGATIVE
Urobilinogen, UA: 1 (ref 0.0–1.0)
WBC, UA: NONE SEEN (ref 0–?)
pH: 6.5 (ref 5.0–8.0)

## 2015-01-17 LAB — HEPATIC FUNCTION PANEL
ALT: 57 U/L — ABNORMAL HIGH (ref 0–53)
AST: 28 U/L (ref 0–37)
Albumin: 4.4 g/dL (ref 3.5–5.2)
Alkaline Phosphatase: 85 U/L (ref 39–117)
Bilirubin, Direct: 0.1 mg/dL (ref 0.0–0.3)
Total Bilirubin: 0.5 mg/dL (ref 0.2–1.2)
Total Protein: 7.1 g/dL (ref 6.0–8.3)

## 2015-01-17 LAB — HIV ANTIBODY (ROUTINE TESTING W REFLEX): HIV 1&2 Ab, 4th Generation: NONREACTIVE

## 2015-01-17 LAB — TSH: TSH: 0.78 u[IU]/mL (ref 0.35–4.50)

## 2015-01-17 MED ORDER — AMPHETAMINE-DEXTROAMPHETAMINE 20 MG PO TABS: 20.0000 mg | ORAL_TABLET | Freq: Two times a day (BID) | ORAL | Status: DC

## 2015-01-17 NOTE — Assessment & Plan Note (Signed)
Intolerant to sleep apnea, encouraged pt to schedule apt with orthodontist for oral appliance.

## 2015-01-17 NOTE — Progress Notes (Signed)
Pre visit review using our clinic review tool, if applicable. No additional management support is needed unless otherwise documented below in the visit note. 

## 2015-01-17 NOTE — Patient Instructions (Signed)
Please complete lab work including UDS prior to leaving.

## 2015-01-17 NOTE — Assessment & Plan Note (Signed)
Continue healthy diet, exercise, weight loss efforts.

## 2015-01-17 NOTE — Progress Notes (Signed)
Subjective:    Patient ID: Joel [name], male    DOB: October 05, 1985, 29 y.o.   MRN: [id number]  HPI  Patient presents today for complete physical.  Immunizations: Tetanus is up to date,  Declines flu shot Diet: reports healthy diet- working on losing weight Exercise: reports regular exercise at gym.   Dental:  Up to date Vision- up to date  ADD- reports symptoms well controlled on adderall   Wt Readings from Last 3 Encounters:  01/17/15 225 lb (102.059 kg)  05/12/14 230 lb 12.8 oz (104.69 kg)  11/23/13 220 lb (99.791 kg)    Review of Systems  Constitutional: Negative for unexpected weight change.  HENT: Negative for hearing loss and rhinorrhea.   Eyes: Negative for visual disturbance.  Respiratory: Negative for cough and shortness of breath.   Cardiovascular: Negative for chest pain.  Gastrointestinal: Negative for diarrhea and constipation.  Genitourinary: Negative for dysuria and frequency.  Musculoskeletal: Negative for back pain.  Skin: Negative for rash.  Neurological: Negative for headaches.  Hematological: Negative for adenopathy.  Psychiatric/Behavioral:       Denies depression/anxiety   Past Medical History  Diagnosis Date  . ADD (attention deficit disorder)   . Tobacco abuse   . Hyperlipemia   . Obstructive sleep apnea 09/10/2013    OSA per split night study 10/15, settings 12 cm h20 full face mask.       Social History   Social History  . Marital Status: Married    Spouse Name: N/A  . Number of Children: N/A  . Years of Education: N/A   Occupational History  . Not on file.   Social History Main Topics  . Smoking status: Current Every Day Smoker -- 0.50 packs/day  . Smokeless tobacco: Never Used     Comment: <1/2 pack  per day  . Alcohol Use: No  . Drug Use: 7.00 per week    Special: Marijuana  . Sexual Activity: Yes   Other Topics Concern  . Not on file   Social History Narrative   Works for [employer]   + tobacco abuse   2 children- daughter age 1 and daughter age 31   Married   Grew up in [place], moved here at age 42   Completed high school   Enjoys outdoor activities   Working on Restaurant manager, fast food          Past Surgical History  Procedure Laterality Date  . No past surgeries      denies surgical history  . Knee surgery Right 12/29/13    Family History  Problem Relation Age of Onset  . Hypertension Mother   . Healthy Father   . Healthy Brother   . Sudden death Neg Hx   . Hyperlipidemia Neg Hx   . Heart attack Neg Hx   . Diabetes Neg Hx     Allergies  Allergen Reactions  . Bee Venom Shortness Of Breath    Yellow Jacket  . Penicillins     Mother allergic to it. Pt never taken it.    Current Outpatient Prescriptions on File Prior to Visit  Medication Sig Dispense Refill  . EPINEPHrine 0.3 mg/0.3 mL IJ SOAJ injection Inject 0.3 mLs (0.3 mg total) into the muscle once. 2 Device 1   No current facility-administered medications on file prior to visit.    BP 128/82 mmHg  Pulse 92  Temp(Src) 97.9 F (36.6 C) (Oral)  Resp 16  Ht 6' (1.829  m)  Wt 225 lb (102.059 kg)  BMI 30.51 kg/m2  SpO2 98%       Objective:   Physical Exam  Physical Exam  Constitutional: He is oriented to person, place, and time. He appears well-developed and well-nourished. No distress.  HENT:  Head: Normocephalic and atraumatic.  Right Ear: Tympanic membrane and ear canal normal.  Left Ear: Tympanic membrane and ear canal normal.  Mouth/Throat: Oropharynx is clear and moist.  Eyes: Pupils are equal, round, and reactive to light. No scleral icterus.  Neck: Normal range of motion. No thyromegaly present.  Cardiovascular: Normal rate and regular rhythm.   No murmur heard. Pulmonary/Chest: Effort normal and breath sounds normal. No respiratory distress. He has no wheezes. He has no rales. He exhibits no tenderness.  Abdominal: Soft. Bowel sounds are normal. He exhibits no  distension and no mass. There is no tenderness. There is no rebound and no guarding.  Musculoskeletal: He exhibits no edema.  Lymphadenopathy:    He has no cervical adenopathy.  Neurological: He is alert and oriented to person, place, and time. He has normal reflexes. He exhibits normal muscle tone. Coordination normal.  Skin: Skin is warm and dry.  Psychiatric: He has a normal mood and affect. His behavior is normal. Judgment and thought content normal.          Assessment & Plan:         Assessment & Plan:

## 2015-01-17 NOTE — Assessment & Plan Note (Signed)
Stable on adderall, continue same, obtain UDS.

## 2015-01-19 ENCOUNTER — Encounter: Payer: Self-pay | Admitting: Family

## 2015-01-19 LAB — LIPID PANEL
Cholesterol: 178 mg/dL (ref 125–200)
HDL: 39 mg/dL — ABNORMAL LOW (ref >=40)
LDL Cholesterol: 125 mg/dL (ref <130)
Total CHOL/HDL Ratio: 4.6 Ratio (ref <=5.0)
Triglycerides: 69 mg/dL (ref <150)
VLDL: 14 mg/dL (ref <30)

## 2015-02-17 ENCOUNTER — Telehealth: Payer: Self-pay | Admitting: Family

## 2015-02-17 DIAGNOSIS — F988 Other specified behavioral and emotional disorders with onset usually occurring in childhood and adolescence: Secondary | ICD-10-CM

## 2015-02-17 NOTE — Telephone Encounter (Signed)
Caller name: Camillo   Relationship to patient: Self   Can be reached: [phone]  Reason for call: Pt is requesting a refill on his ADDERALL Rx. Please advise when ready for pick up

## 2015-02-18 MED ORDER — AMPHETAMINE-DEXTROAMPHETAMINE 20 MG PO TABS: 20.0000 mg | ORAL_TABLET | Freq: Two times a day (BID) | ORAL | Status: DC

## 2015-02-18 NOTE — Telephone Encounter (Signed)
Left message that Rx is ready for pick up at the front desk. Noted placed on Rx that UDS is due when he picks it up.

## 2015-02-18 NOTE — Telephone Encounter (Signed)
Last RX:  01/17/15 Next UDS: due now Last OV:  01/17/15  Rx printed and forwarded to PCP for signature with note to provide UDS at time of pick up.

## 2015-03-02 ENCOUNTER — Encounter: Payer: Self-pay | Admitting: Family

## 2015-03-23 ENCOUNTER — Telehealth: Payer: Self-pay | Admitting: Family

## 2015-03-23 NOTE — Telephone Encounter (Signed)
Caller name: Self  Can be reached: [phone]   Reason for call: refill amphetamine-dextroamphetamine (ADDERALL) 20 MG tablet [id number]

## 2015-03-24 ENCOUNTER — Other Ambulatory Visit: Payer: Self-pay

## 2015-03-24 DIAGNOSIS — F988 Other specified behavioral and emotional disorders with onset usually occurring in childhood and adolescence: Secondary | ICD-10-CM

## 2015-03-24 MED ORDER — AMPHETAMINE-DEXTROAMPHETAMINE 20 MG PO TABS: 20.0000 mg | ORAL_TABLET | Freq: Two times a day (BID) | ORAL | Status: DC

## 2015-04-04 ENCOUNTER — Encounter: Payer: Self-pay | Admitting: Family

## 2015-04-26 ENCOUNTER — Telehealth: Payer: Self-pay | Admitting: Family

## 2015-04-26 DIAGNOSIS — F988 Other specified behavioral and emotional disorders with onset usually occurring in childhood and adolescence: Secondary | ICD-10-CM

## 2015-04-26 MED ORDER — AMPHETAMINE-DEXTROAMPHETAMINE 20 MG PO TABS: 20.0000 mg | ORAL_TABLET | Freq: Two times a day (BID) | ORAL | Status: DC

## 2015-04-26 NOTE — Telephone Encounter (Signed)
Last Rx 03/24/15.  Next UDS due 05/01/15, will obtain at next refill. Rx printed and forwarded to PCP for signature.

## 2015-04-26 NOTE — Telephone Encounter (Signed)
Pat requesting a refill amphetamine-dextroamphetamine (ADDERALL) 20 MG tablet

## 2015-04-26 NOTE — Telephone Encounter (Signed)
Rx signed and placed at front desk for pick up. Left detailed message on pt's cell#.

## 2015-05-27 ENCOUNTER — Telehealth: Payer: Self-pay | Admitting: Family

## 2015-05-27 DIAGNOSIS — F988 Other specified behavioral and emotional disorders with onset usually occurring in childhood and adolescence: Secondary | ICD-10-CM

## 2015-05-27 MED ORDER — AMPHETAMINE-DEXTROAMPHETAMINE 20 MG PO TABS: 20.0000 mg | ORAL_TABLET | Freq: Two times a day (BID) | ORAL | Status: DC

## 2015-05-27 NOTE — Telephone Encounter (Signed)
Pt called in a refill for his Adderall Rx.   CB when ready. [phone]

## 2015-05-27 NOTE — Telephone Encounter (Signed)
Rx placed at front desk for pick up and pt notified. 

## 2015-05-27 NOTE — Telephone Encounter (Signed)
Last adderall RX 04/26/15 Next UDS due now Next OV: 06/28/15 Rx printed and forwarded to PCP for signature.

## 2015-05-31 DIAGNOSIS — Z79899 Other long term (current) drug therapy: Secondary | ICD-10-CM | POA: Diagnosis not present

## 2015-06-24 ENCOUNTER — Telehealth: Payer: Self-pay | Admitting: *Deleted

## 2015-06-24 NOTE — Telephone Encounter (Signed)
Unable to reach patient at time of pre-visit call. Was told that mobile # was wrong number by person who answered. No answer and no voicemail on home phone number.

## 2015-06-28 ENCOUNTER — Telehealth: Payer: Self-pay | Admitting: Family

## 2015-06-28 ENCOUNTER — Encounter: Payer: BLUE CROSS/BLUE SHIELD | Admitting: Family

## 2015-06-28 DIAGNOSIS — F988 Other specified behavioral and emotional disorders with onset usually occurring in childhood and adolescence: Secondary | ICD-10-CM

## 2015-06-28 MED ORDER — AMPHETAMINE-DEXTROAMPHETAMINE 20 MG PO TABS: 20.0000 mg | ORAL_TABLET | Freq: Two times a day (BID) | ORAL | Status: DC

## 2015-06-28 NOTE — Telephone Encounter (Signed)
Last adderall RX:  05/27/15, #60 Last UDS: 05/31/15, moderate risk Next OV: 07/21/15  Rx printed and forwarded to PCP for signature.

## 2015-06-28 NOTE — Telephone Encounter (Signed)
Rx placed at front desk for pick up and left detailed message on pt's voicemail.

## 2015-06-28 NOTE — Telephone Encounter (Signed)
Relationship to patient: self Can be reached: [phone]  Reason for call: Pt needing refill on adderall. Has 2 days left. Not able to keep appt today because his flight was cancelled and he is stuck in [place]. He'll be home at some point this evening. He has rescheduled his appt for 6/22. Please call when RX ready for pick up.

## 2015-07-21 ENCOUNTER — Encounter: Payer: BLUE CROSS/BLUE SHIELD | Admitting: Family

## 2015-07-28 ENCOUNTER — Encounter: Payer: Self-pay | Admitting: Family

## 2015-08-01 ENCOUNTER — Telehealth: Payer: Self-pay | Admitting: Family

## 2015-08-01 DIAGNOSIS — F988 Other specified behavioral and emotional disorders with onset usually occurring in childhood and adolescence: Secondary | ICD-10-CM

## 2015-08-01 MED ORDER — AMPHETAMINE-DEXTROAMPHETAMINE 20 MG PO TABS
20.0000 mg | ORAL_TABLET | Freq: Two times a day (BID) | ORAL | Status: DC
Start: 2015-08-01 — End: 2015-09-02

## 2015-08-01 NOTE — Telephone Encounter (Signed)
Last rx:  06/28/15, #60 UDS: moderate and 08/31/15 OV: cancelled and needs to r/s  Rx printed and forwarded to PCP for signature. Will need to contact pt to schedule appt.

## 2015-08-01 NOTE — Telephone Encounter (Signed)
Rx placed at front desk for pick up and pt has been notified. 

## 2015-08-01 NOTE — Telephone Encounter (Signed)
Caller name: Relationship to patient: Self  Can be reached: [phone]   Reason for call: Request refill on amphetamine-dextroamphetamine (ADDERALL) 20 MG tablet [id number]

## 2015-09-02 ENCOUNTER — Telehealth: Payer: Self-pay | Admitting: Family

## 2015-09-02 DIAGNOSIS — F988 Other specified behavioral and emotional disorders with onset usually occurring in childhood and adolescence: Secondary | ICD-10-CM

## 2015-09-02 MED ORDER — AMPHETAMINE-DEXTROAMPHETAMINE 20 MG PO TABS
20.0000 mg | ORAL_TABLET | Freq: Two times a day (BID) | ORAL | 0 refills | Status: DC
Start: 2015-09-02 — End: 2015-10-07

## 2015-09-02 NOTE — Telephone Encounter (Signed)
Last Rx: 08/01/15 UDS: due now  Rx printed and forwarded to PCP for signature. Will have pt provide UDS when he picks up Rx.

## 2015-09-02 NOTE — Telephone Encounter (Signed)
Rx placed at front desk for pick up and detailed message left on pt's voicemail. 

## 2015-09-02 NOTE — Telephone Encounter (Signed)
Relationship to patient: self Can be reached: [phone]  Reason for call: Pt needing refill on adderall. He is ok til Monday. Please call when RX ready for pick up.

## 2015-09-07 ENCOUNTER — Encounter: Payer: Self-pay | Admitting: Family

## 2015-09-07 ENCOUNTER — Ambulatory Visit (INDEPENDENT_AMBULATORY_CARE_PROVIDER_SITE_OTHER): Payer: BLUE CROSS/BLUE SHIELD | Admitting: Family

## 2015-09-07 ENCOUNTER — Telehealth: Payer: Self-pay | Admitting: Family

## 2015-09-07 VITALS — BP 126/86 | HR 73 | Temp 97.7°F | Resp 16 | Ht 72.0 in | Wt 218.2 lb

## 2015-09-07 DIAGNOSIS — F988 Other specified behavioral and emotional disorders with onset usually occurring in childhood and adolescence: Secondary | ICD-10-CM

## 2015-09-07 DIAGNOSIS — D179 Benign lipomatous neoplasm, unspecified: Secondary | ICD-10-CM | POA: Diagnosis not present

## 2015-09-07 DIAGNOSIS — Z79899 Other long term (current) drug therapy: Secondary | ICD-10-CM | POA: Diagnosis not present

## 2015-09-07 DIAGNOSIS — F909 Attention-deficit hyperactivity disorder, unspecified type: Secondary | ICD-10-CM

## 2015-09-07 NOTE — Progress Notes (Signed)
Subjective:    Patient ID: Joel [name], male    DOB: [birth year], 30 y.o.   MRN: [id number]  HPI  Pt presents today for folllow up.  1) ADHD- reports that his ADHD remains stable on current dose of adderall.    2) Lipoma- right side of neck. Would like referral for excision. Has had MRI and [place] in the past at [facility]- told "lipoma".   Has been working on [employer].He is working out regularly.   Wt Readings from Last 3 Encounters:  09/07/15 218 lb 3.2 oz (99 kg)  01/17/15 225 lb (102.1 kg)  05/12/14 230 lb 12.8 oz (104.7 kg)      Review of Systems See HPI  Past Medical History:  Diagnosis Date  . ADD (attention deficit disorder)   . Hyperlipemia   . Obstructive sleep apnea 09/10/2013   OSA per split night study 10/15, settings 12 cm h20 full face mask.     . Tobacco abuse      Social History   Social History  . Marital status: Married    Spouse name: N/A  . Number of children: N/A  . Years of education: N/A   Occupational History  . Not on file.   Social History Main Topics  . Smoking status: Current Every Day Smoker    Packs/day: 0.50  . Smokeless tobacco: Never Used     Comment: <1/2 pack  per day  . Alcohol use No  . Drug use:     Frequency: 7.0 times per week    Types: Marijuana  . Sexual activity: Yes   Other Topics Concern  . Not on file   Social History Narrative   Works for [employer]   + tobacco abuse   2 children- daughter age 52 and daughter age 88   Married   Grew up in [place], moved here at age 17   Completed high school   Enjoys outdoor activities   Working on Restaurant manager, fast food          Past Surgical History:  Procedure Laterality Date  . KNEE SURGERY Right 12/29/13  . NO PAST SURGERIES     denies surgical history    Family History  Problem Relation Age of Onset  . Hypertension Mother   . Healthy Father   . Healthy Brother   . Sudden death Neg Hx   . Hyperlipidemia Neg  Hx   . Heart attack Neg Hx   . Diabetes Neg Hx     Allergies  Allergen Reactions  . Bee Venom Shortness Of Breath    Yellow Jacket  . Penicillins     Mother allergic to it. Pt never taken it.    Current Outpatient Prescriptions on File Prior to Visit  Medication Sig Dispense Refill  . amphetamine-dextroamphetamine (ADDERALL) 20 MG tablet Take 1 tablet (20 mg total) by mouth 2 (two) times daily. 60 tablet 0  . EPINEPHrine 0.3 mg/0.3 mL IJ SOAJ injection Inject 0.3 mLs (0.3 mg total) into the muscle once. 2 Device 1   No current facility-administered medications on file prior to visit.     BP 126/86   Pulse 73   Temp 97.7 F (36.5 C) (Oral)   Resp 16   Ht 6' (1.829 m)   Wt 218 lb 3.2 oz (99 kg)   SpO2 98% Comment: room air  BMI 29.59 kg/m       Objective:   Physical Exam  Constitutional: He is  oriented to person, place, and time. He appears well-developed and well-nourished. No distress.  HENT:  Head: Normocephalic and atraumatic.  Neck: Neck supple.    Large non-tender, mobile soft mass noted left lateral/posterior neck.   Cardiovascular: Normal rate and regular rhythm.   No murmur heard. Pulmonary/Chest: Effort normal and breath sounds normal. No respiratory distress. He has no wheezes. He has no rales.  Musculoskeletal: He exhibits no edema.  Lymphadenopathy:    He has no cervical adenopathy.  Neurological: He is alert and oriented to person, place, and time.  Skin: Skin is warm and dry.  Psychiatric: He has a normal mood and affect. His behavior is normal. Thought content normal.          Assessment & Plan:  Lipoma neck- desires excision. Will refer to ENT for further evaluation.

## 2015-09-07 NOTE — Progress Notes (Signed)
Pre visit review using our clinic review tool, if applicable. No additional management support is needed unless otherwise documented below in the visit note. 

## 2015-09-07 NOTE — Telephone Encounter (Signed)
See mychart.  

## 2015-09-07 NOTE — Patient Instructions (Signed)
You will be contacted about your referral to ENT.

## 2015-09-07 NOTE — Assessment & Plan Note (Signed)
Stable on current dose of adderall. Will continue same, obtain UDS today.

## 2015-09-29 DIAGNOSIS — D170 Benign lipomatous neoplasm of skin and subcutaneous tissue of head, face and neck: Secondary | ICD-10-CM | POA: Diagnosis not present

## 2015-10-06 ENCOUNTER — Telehealth: Payer: Self-pay | Admitting: Family

## 2015-10-06 DIAGNOSIS — G4733 Obstructive sleep apnea (adult) (pediatric): Secondary | ICD-10-CM

## 2015-10-06 DIAGNOSIS — F988 Other specified behavioral and emotional disorders with onset usually occurring in childhood and adolescence: Secondary | ICD-10-CM

## 2015-10-06 NOTE — Telephone Encounter (Signed)
Pt says that he has discussed receiving a c-pap machine with his PCP. Pt says that he is having surgery on Tuesday and would like to know If he could have machine before surgery?    ALSO, pt would like a refill for his ADDERALL     CB: [phone]

## 2015-10-07 MED ORDER — AMPHETAMINE-DEXTROAMPHETAMINE 20 MG PO TABS: 20.0000 mg | ORAL_TABLET | Freq: Two times a day (BID) | ORAL | 0 refills | Status: DC

## 2015-10-07 NOTE — Telephone Encounter (Signed)
See rx for adderall. Order placed for CPAP. They will bring him fresh tubing and mask with new machine.

## 2015-10-07 NOTE — Telephone Encounter (Signed)
Rx placed at front desk for pick up. Spoke with Norvel Richards at [facility] [phone]) and advised of pt's expedited request. Faxed order, sleep study and insurance card to [phone]. Left detailed message informing pt of Rx and CPAP status and to call if any questions.

## 2015-10-07 NOTE — Telephone Encounter (Signed)
Spoke with pt. He was set up on CPAP a couple of years ago but had turned in CPAP machine. He states that he still has tubing and mask but needs to get another machine. Do we need to send new Rx? Looks like he was using [employer]. Pended order.   Also requests refill of adderall. Last Rx 09/02/15, #60. UDS:  Moderate, 09/07/15.  Rx printed and forwarded to PCP for signature.

## 2015-10-10 ENCOUNTER — Telehealth: Payer: Self-pay | Admitting: *Deleted

## 2015-10-10 ENCOUNTER — Encounter: Payer: Self-pay | Admitting: Family

## 2015-10-10 NOTE — Telephone Encounter (Signed)
See letter.

## 2015-10-10 NOTE — Telephone Encounter (Signed)
Received call from [place] at [facility] stating insurance may not pay for pt to get CPAP again without letter from [place] stating pt was non-compliant with CPAP previously and would like to try it again.  Please advise.

## 2015-10-10 NOTE — Telephone Encounter (Signed)
Letter faxed.

## 2015-10-13 DIAGNOSIS — G4733 Obstructive sleep apnea (adult) (pediatric): Secondary | ICD-10-CM | POA: Diagnosis not present

## 2015-11-09 ENCOUNTER — Telehealth: Payer: Self-pay | Admitting: Family

## 2015-11-09 MED ORDER — AMPHETAMINE-DEXTROAMPHETAMINE 20 MG PO TABS: 20.0000 mg | ORAL_TABLET | Freq: Two times a day (BID) | ORAL | 0 refills | Status: DC

## 2015-11-09 NOTE — Addendum Note (Signed)
Addended by: Kelle Darting A on: 11/09/2015 03:05 PM   Modules accepted: Orders

## 2015-11-09 NOTE — Telephone Encounter (Addendum)
Last rf:  10/07/15, #60 Last OV:  09/07/15 UDS: 09/07/15  Rx printed and forwarded to PCP for signature.

## 2015-11-09 NOTE — Telephone Encounter (Signed)
Rx placed at front desk and pt notified. 

## 2015-11-09 NOTE — Telephone Encounter (Signed)
Patient is requesting a refill of  amphetamine-dextroamphetamine (ADDERALL) 20 MG tablet   Patient relation:self Patient phone: [phone]

## 2015-11-12 DIAGNOSIS — G4733 Obstructive sleep apnea (adult) (pediatric): Secondary | ICD-10-CM | POA: Diagnosis not present

## 2015-12-12 ENCOUNTER — Telehealth: Payer: Self-pay | Admitting: Family

## 2015-12-12 MED ORDER — AMPHETAMINE-DEXTROAMPHETAMINE 20 MG PO TABS: 20.0000 mg | ORAL_TABLET | Freq: Two times a day (BID) | ORAL | 0 refills | Status: DC

## 2015-12-12 NOTE — Telephone Encounter (Signed)
Last adderall Rx:  11/09/15, #60 UDS: moderate, 09/07/15 and due now. Last OV: 09/07/15  Rx printed and forwarded to PCP for signature.

## 2015-12-12 NOTE — Telephone Encounter (Signed)
Patient is requesting a refill of amphetamine-dextroamphetamine (ADDERALL) 20 MG tablet Please advise.    Patient phone: [phone]

## 2015-12-12 NOTE — Telephone Encounter (Signed)
Rx placed at front desk for pick up and pt has been notified. 

## 2015-12-13 DIAGNOSIS — G4733 Obstructive sleep apnea (adult) (pediatric): Secondary | ICD-10-CM | POA: Diagnosis not present

## 2015-12-15 DIAGNOSIS — F988 Other specified behavioral and emotional disorders with onset usually occurring in childhood and adolescence: Secondary | ICD-10-CM | POA: Diagnosis not present

## 2016-01-12 ENCOUNTER — Telehealth: Payer: Self-pay | Admitting: Family

## 2016-01-12 NOTE — Telephone Encounter (Signed)
Patient is requesting refills of amphetamine-dextroamphetamine (ADDERALL) 20 MG tablet Please advise   Patient phone: [phone]

## 2016-01-13 MED ORDER — AMPHETAMINE-DEXTROAMPHETAMINE 20 MG PO TABS: 20.0000 mg | ORAL_TABLET | Freq: Two times a day (BID) | ORAL | 0 refills | Status: DC

## 2016-01-13 NOTE — Telephone Encounter (Signed)
Rx placed at front desk for pick up and detailed message on pt's cell#.

## 2016-01-13 NOTE — Telephone Encounter (Signed)
Last Rx 12/12/15 UDS: 12/14/15 Last OV: 09/07/15 Next OV: CPE due 03/10/15 (needs to schedule)  Rx printed and forwarded to PCP for signature.

## 2016-01-18 HISTORY — PX: WISDOM TOOTH EXTRACTION: SHX21

## 2016-02-20 ENCOUNTER — Telehealth: Payer: Self-pay | Admitting: Family

## 2016-02-20 MED ORDER — AMPHETAMINE-DEXTROAMPHETAMINE 20 MG PO TABS: 20.0000 mg | ORAL_TABLET | Freq: Two times a day (BID) | ORAL | 0 refills | Status: DC

## 2016-02-20 NOTE — Telephone Encounter (Signed)
Last OV: 09/07/15 Next OV: 03/12/16 UDS: 12/14/15 Last RF: 01/13/16, #60  Rx printed and forwarded to covering Provider ([place]) for signature.

## 2016-02-20 NOTE — Telephone Encounter (Signed)
Rx placed at front desk and pt notified. 

## 2016-02-20 NOTE — Telephone Encounter (Signed)
Relation to [id number] Call back [place]: [id number] - [place], [place] [phone] (Phone) [phone] (Fax)     Reason for call:  Patient requesting a refill amphetamine-dextroamphetamine (ADDERALL) 20 MG tablet

## 2016-03-09 ENCOUNTER — Encounter: Payer: Self-pay | Admitting: Family

## 2016-03-09 ENCOUNTER — Ambulatory Visit (INDEPENDENT_AMBULATORY_CARE_PROVIDER_SITE_OTHER): Payer: BLUE CROSS/BLUE SHIELD | Admitting: Family

## 2016-03-09 VITALS — BP 125/80 | HR 89 | Temp 98.4°F | Resp 16 | Ht 72.0 in | Wt 222.6 lb

## 2016-03-09 DIAGNOSIS — F988 Other specified behavioral and emotional disorders with onset usually occurring in childhood and adolescence: Secondary | ICD-10-CM | POA: Diagnosis not present

## 2016-03-09 DIAGNOSIS — G4733 Obstructive sleep apnea (adult) (pediatric): Secondary | ICD-10-CM

## 2016-03-09 DIAGNOSIS — Z0000 Encounter for general adult medical examination without abnormal findings: Secondary | ICD-10-CM | POA: Diagnosis not present

## 2016-03-09 DIAGNOSIS — D170 Benign lipomatous neoplasm of skin and subcutaneous tissue of head, face and neck: Secondary | ICD-10-CM | POA: Diagnosis not present

## 2016-03-09 LAB — CBC WITH DIFFERENTIAL/PLATELET
Basophils Absolute: 0 10*3/uL (ref 0.0–0.1)
Basophils Relative: 0.3 % (ref 0.0–3.0)
Eosinophils Absolute: 0.1 10*3/uL (ref 0.0–0.7)
Eosinophils Relative: 1.2 % (ref 0.0–5.0)
HCT: 46.1 % (ref 39.0–52.0)
Hemoglobin: 15.9 g/dL (ref 13.0–17.0)
Lymphocytes Relative: 38.6 % (ref 12.0–46.0)
Lymphs Abs: 2.7 10*3/uL (ref 0.7–4.0)
MCHC: 34.5 g/dL (ref 30.0–36.0)
MCV: 94.4 fl (ref 78.0–100.0)
Monocytes Absolute: 0.5 10*3/uL (ref 0.1–1.0)
Monocytes Relative: 7.9 % (ref 3.0–12.0)
Neutro Abs: 3.6 10*3/uL (ref 1.4–7.7)
Neutrophils Relative %: 52 % (ref 43.0–77.0)
Platelets: 325 10*3/uL (ref 150.0–400.0)
RBC: 4.89 Mil/uL (ref 4.22–5.81)
RDW: 12.4 % (ref 11.5–15.5)
WBC: 6.9 10*3/uL (ref 4.0–10.5)

## 2016-03-09 LAB — HEPATIC FUNCTION PANEL
ALT: 97 U/L — ABNORMAL HIGH (ref 0–53)
AST: 53 U/L — ABNORMAL HIGH (ref 0–37)
Albumin: 4.5 g/dL (ref 3.5–5.2)
Alkaline Phosphatase: 93 U/L (ref 39–117)
Bilirubin, Direct: 0.2 mg/dL (ref 0.0–0.3)
Total Bilirubin: 0.7 mg/dL (ref 0.2–1.2)
Total Protein: 7.2 g/dL (ref 6.0–8.3)

## 2016-03-09 LAB — LIPID PANEL
Cholesterol: 184 mg/dL (ref 0–200)
HDL: 40.6 mg/dL (ref >39.00)
LDL Cholesterol: 131 mg/dL — ABNORMAL HIGH (ref 0–99)
NonHDL: 143.75
Total CHOL/HDL Ratio: 5
Triglycerides: 66 mg/dL (ref 0.0–149.0)
VLDL: 13.2 mg/dL (ref 0.0–40.0)

## 2016-03-09 LAB — URINALYSIS, ROUTINE W REFLEX MICROSCOPIC
Bilirubin Urine: NEGATIVE
Hgb urine dipstick: NEGATIVE
Ketones, ur: NEGATIVE
Leukocytes, UA: NEGATIVE
Nitrite: NEGATIVE
Specific Gravity, Urine: 1.01 (ref 1.000–1.030)
Total Protein, Urine: NEGATIVE
Urine Glucose: NEGATIVE
Urobilinogen, UA: 0.2 (ref 0.0–1.0)
pH: 6.5 (ref 5.0–8.0)

## 2016-03-09 LAB — BASIC METABOLIC PANEL
BUN: 14 mg/dL (ref 6–23)
CO2: 27 mEq/L (ref 19–32)
Calcium: 9.4 mg/dL (ref 8.4–10.5)
Chloride: 107 mEq/L (ref 96–112)
Creatinine, Ser: 0.96 mg/dL (ref 0.40–1.50)
GFR: 95.88 mL/min (ref >60.00)
Glucose, Bld: 109 mg/dL — ABNORMAL HIGH (ref 70–99)
Potassium: 4.4 mEq/L (ref 3.5–5.1)
Sodium: 138 mEq/L (ref 135–145)

## 2016-03-09 LAB — TSH: TSH: 0.42 u[IU]/mL (ref 0.35–4.50)

## 2016-03-09 NOTE — Assessment & Plan Note (Signed)
Stable on adderall.

## 2016-03-09 NOTE — Assessment & Plan Note (Signed)
Stable, monitor.  Pt does not wish to pursue removal due to risk of nerve damage.

## 2016-03-09 NOTE — Assessment & Plan Note (Signed)
Continue healthy diet, exercise. Obtain follow up lab work.

## 2016-03-09 NOTE — Progress Notes (Signed)
Subjective:    Patient ID: Joel [name], male    DOB: July 25, 1985, 31 y.o.   MRN: [id number]  HPI  Patient presents today for complete physical.  Immunizations:  Tetanus is up to date Diet/exercise: eating healthy, walks his dog every day Dental:  Up to date  Wt Readings from Last 3 Encounters:  03/09/16 222 lb 9.6 oz (101 kg)  09/07/15 218 lb 3.2 oz (99 kg)  01/17/15 225 lb (102.1 kg)   ADHD- reports that his adhd symptoms are well controlled on adderall.  Reports that he has recently been promoted at work and this is going well.   OSA- Reports that he was having trouble with CPAP compliance due to recent increased work travel. As a result his insurance rescinded his CPAP. Notes that he is sleeping well and feeling rested.   Lipoma- right lateral neck.  Reports that he has decided not to remove due to risk of damage to overlying nerve.   Review of Systems  Constitutional: Negative for unexpected weight change.  HENT: Negative for rhinorrhea.   Respiratory: Negative for cough.   Gastrointestinal: Negative for constipation and diarrhea.  Genitourinary: Negative for dysuria and frequency.  Musculoskeletal: Positive for arthralgias. Negative for myalgias.  Neurological: Negative for headaches.  Hematological:       Reports swelling right side of neck (lipoma)  Psychiatric/Behavioral:       Denies depression   Past Medical History:  Diagnosis Date  . ADD (attention deficit disorder)   . Hyperlipemia   . Obstructive sleep apnea 09/10/2013   OSA per split night study 10/15, settings 12 cm h20 full face mask.     . Tobacco abuse      Social History   Social History  . Marital status: Married    Spouse name: N/A  . Number of children: N/A  . Years of education: N/A   Occupational History  . Not on file.   Social History Main Topics  . Smoking status: Current Every Day Smoker    Packs/day: 0.50  . Smokeless tobacco: Never Used     Comment: <1/2 pack  per day    . Alcohol use Yes     Comment: case a beer monthly   . Drug use: No  . Sexual activity: Yes   Other Topics Concern  . Not on file   Social History Narrative   Works for [employer]   + tobacco abuse   2 children- daughter age 61 and daughter age 94   Married   Grew up in [place], moved here at age 20   Completed high school   Enjoys outdoor activities   Working on Restaurant manager, fast food          Past Surgical History:  Procedure Laterality Date  . KNEE SURGERY Right 12/29/13  . NO PAST SURGERIES     denies surgical history  . WISDOM TOOTH EXTRACTION  01/18/2016    Family History  Problem Relation Age of Onset  . Hypertension Mother   . Healthy Father   . Healthy Brother   . Sudden death Neg Hx   . Hyperlipidemia Neg Hx   . Heart attack Neg Hx   . Diabetes Neg Hx     Allergies  Allergen Reactions  . Bee Venom Shortness Of Breath    Yellow Jacket  . Penicillins     Mother allergic to it. Pt never taken it.    Current Outpatient Prescriptions on File Prior  to Visit  Medication Sig Dispense Refill  . amphetamine-dextroamphetamine (ADDERALL) 20 MG tablet Take 1 tablet (20 mg total) by mouth 2 (two) times daily. 60 tablet 0  . EPINEPHrine 0.3 mg/0.3 mL IJ SOAJ injection Inject 0.3 mLs (0.3 mg total) into the muscle once. 2 Device 1   No current facility-administered medications on file prior to visit.     BP 125/80 (BP Location: Right Arm, Cuff Size: Large)   Pulse 89   Temp 98.4 F (36.9 C) (Oral)   Resp 16   Ht 6' (1.829 m)   Wt 222 lb 9.6 oz (101 kg)   SpO2 100%   BMI 30.19 kg/m       Objective:   Physical Exam  Physical Exam  Constitutional: He is oriented to person, place, and time. He appears well-developed and well-nourished. No distress.  HENT:  Head: Normocephalic and atraumatic.  Right Ear: Tympanic membrane and ear canal normal.  Left Ear: Tympanic membrane and ear canal normal.  Mouth/Throat: Oropharynx is  clear and moist.  Eyes: Pupils are equal, round, and reactive to light. No scleral icterus.  Neck: Normal range of motion. No thyromegaly present.  Cardiovascular: Normal rate and regular rhythm.   No murmur heard. Pulmonary/Chest: Effort normal and breath sounds normal. No respiratory distress. He has no wheezes. He has no rales. He exhibits no tenderness.  Abdominal: Soft. Bowel sounds are normal. He exhibits no distension and no mass. There is no tenderness. There is no rebound and no guarding.  Musculoskeletal: He exhibits no edema.  + large lipoma at base of right lateral neck Lymphadenopathy:    He has no cervical adenopathy.  Neurological: He is alert and oriented to person, place, and time. He has normal patellar reflexes. He exhibits normal muscle tone. Coordination normal.  Skin: Skin is warm and dry.  Psychiatric: He has a normal mood and affect. His behavior is normal. Judgment and thought content normal.           Assessment & Plan:        Assessment & Plan:

## 2016-03-09 NOTE — Progress Notes (Signed)
Pre visit review using our clinic review tool, if applicable. No additional management support is needed unless otherwise documented below in the visit note. 

## 2016-03-09 NOTE — Assessment & Plan Note (Signed)
Uncontrolled, as pt is not currently on cpap. Will see about getting him fitted for an oral appliance. Refer to othodontics.

## 2016-03-09 NOTE — Patient Instructions (Signed)
Please complete lab work prior to leaving. Keep up the good work with healthy diet, exercise. You will be contacted about your referral to Dr. Ron Parker (orthodontist) to discuss oral appliance for sleep apnea.

## 2016-03-11 ENCOUNTER — Encounter: Payer: Self-pay | Admitting: Family

## 2016-03-12 ENCOUNTER — Encounter: Payer: BLUE CROSS/BLUE SHIELD | Admitting: Family

## 2016-03-22 ENCOUNTER — Other Ambulatory Visit: Payer: Self-pay | Admitting: Family

## 2016-03-22 MED ORDER — AMPHETAMINE-DEXTROAMPHETAMINE 20 MG PO TABS: 20.0000 mg | ORAL_TABLET | Freq: Two times a day (BID) | ORAL | 0 refills | Status: DC

## 2016-03-22 NOTE — Telephone Encounter (Signed)
Last filled: 02/20/16 Amt: 60,0 Last OV: 03/09/16 Need CCS and UDS Rx printed and forwarded to PCP.

## 2016-03-22 NOTE — Telephone Encounter (Signed)
Caller name: Relationship to patient: Self Can be reached: [phone] Pharmacy:  Reason for call: Refill on amphetamine-dextroamphetamine (ADDERALL) 20 MG tablet [id number]

## 2016-03-23 NOTE — Telephone Encounter (Signed)
Pt came in to the office on his lunch break and confirmed [facility] on [address]. Did not have time to wait on contract or give UDS. States he will come back later to sign contract, provide UDS and pick up Rx. Rx and contract placed at front desk.

## 2016-03-23 NOTE — Telephone Encounter (Signed)
Left message for pt to return my call. Rx is ready for pick up but needs to update CSC and need to verify which pharmacy he will be using for this prescription.

## 2016-03-25 NOTE — Telephone Encounter (Signed)
Please contact pt re: unread mychart message. 

## 2016-03-27 DIAGNOSIS — Z79899 Other long term (current) drug therapy: Secondary | ICD-10-CM | POA: Diagnosis not present

## 2016-03-27 NOTE — Telephone Encounter (Signed)
Mailed letter to pt

## 2016-04-19 ENCOUNTER — Other Ambulatory Visit: Payer: Self-pay | Admitting: Family

## 2016-04-19 NOTE — Telephone Encounter (Signed)
Self.    Refill request for Adderall    CB# on file when ready for pick up.

## 2016-04-19 NOTE — Telephone Encounter (Signed)
Pt. Needs refill on Adderall   Last RX:03/22/16  Last OV:03/09/16  Next OV: None  UDS:05/31/15  CSC:05/12/14   Please advice.

## 2016-04-20 MED ORDER — AMPHETAMINE-DEXTROAMPHETAMINE 20 MG PO TABS: 20.0000 mg | ORAL_TABLET | Freq: Two times a day (BID) | ORAL | 0 refills | Status: DC

## 2016-04-20 NOTE — Telephone Encounter (Signed)
See rx. 

## 2016-04-20 NOTE — Telephone Encounter (Signed)
Spoke with pt notified him that Rx can be picked up at front desk.

## 2016-05-21 ENCOUNTER — Telehealth: Payer: Self-pay | Admitting: Family

## 2016-05-21 MED ORDER — AMPHETAMINE-DEXTROAMPHETAMINE 20 MG PO TABS: 20.0000 mg | ORAL_TABLET | Freq: Two times a day (BID) | ORAL | 0 refills | Status: DC

## 2016-05-21 NOTE — Telephone Encounter (Signed)
Last RX: 04/20/16, #60 Last OV: 03/09/16 Next OV: due 09/06/16, not scheduled UDS: 03/23/16, moderate  Rx printed and forwarded to PCP for signature.

## 2016-05-21 NOTE — Telephone Encounter (Signed)
Left detailed message on cell# that Rx is ready for pick up at our front desk.

## 2016-05-21 NOTE — Telephone Encounter (Signed)
Caller name: Relationship to patient: Self  Can be reached: [phone]  Pharmacy:  Reason for call: Refill amphetamine-dextroamphetamine (ADDERALL) 20 MG tablet [[id number]]

## 2016-06-19 ENCOUNTER — Telehealth: Payer: Self-pay | Admitting: Family

## 2016-06-19 NOTE — Telephone Encounter (Signed)
Caller name: Kodie Relationship to patient: self Can be reached: [phone]   Reason for call: Pt called for refill on adderall. Has 4 left. Takes 2/day. Please call when RX ready for pick up.

## 2016-06-20 MED ORDER — AMPHETAMINE-DEXTROAMPHETAMINE 20 MG PO TABS: 20.0000 mg | ORAL_TABLET | Freq: Two times a day (BID) | ORAL | 0 refills | Status: DC

## 2016-06-20 NOTE — Telephone Encounter (Signed)
Last RX: 05/21/16, #60 Last OV: 03/09/16 Next OV: due 09/06/16 UDS: 03/23/16 and due now.  Rx printed and forwarded to covering Provider for signature.

## 2016-06-20 NOTE — Telephone Encounter (Signed)
Left message on cell# that rx is ready for pick up at the front desk.

## 2016-07-24 ENCOUNTER — Telehealth: Payer: Self-pay | Admitting: Family

## 2016-07-24 MED ORDER — AMPHETAMINE-DEXTROAMPHETAMINE 20 MG PO TABS: 20.0000 mg | ORAL_TABLET | Freq: Two times a day (BID) | ORAL | 0 refills | Status: DC

## 2016-07-24 NOTE — Telephone Encounter (Signed)
Detailed message left on pt's voicemail and rx placed at front desk with note to collect UDS today.

## 2016-07-24 NOTE — Telephone Encounter (Signed)
Caller name: Relationship to patient: Self Can be reached: [phone] Pharmacy:  Reason for call: Refill amphetamine-dextroamphetamine (ADDERALL) 20 MG tablet

## 2016-07-24 NOTE — Telephone Encounter (Signed)
Yes please

## 2016-07-24 NOTE — Telephone Encounter (Signed)
Melissa -- do we need to collect UDS with this prescription today?  Last RX: 06/20/16, #60 Last OV: 03/09/16 Next OV: due 09/06/16, not yet scheduled UDS: 03/23/16   Rx printed and forwarded to PCP for signature.

## 2016-07-26 ENCOUNTER — Encounter: Payer: Self-pay | Admitting: Family

## 2016-07-26 DIAGNOSIS — Z79899 Other long term (current) drug therapy: Secondary | ICD-10-CM | POA: Diagnosis not present

## 2016-08-24 DIAGNOSIS — W57XXXA Bitten or stung by nonvenomous insect and other nonvenomous arthropods, initial encounter: Secondary | ICD-10-CM | POA: Diagnosis not present

## 2016-08-24 DIAGNOSIS — L0390 Cellulitis, unspecified: Secondary | ICD-10-CM | POA: Diagnosis not present

## 2016-08-27 ENCOUNTER — Telehealth: Payer: Self-pay | Admitting: Family

## 2016-08-27 MED ORDER — AMPHETAMINE-DEXTROAMPHETAMINE 20 MG PO TABS: 20.0000 mg | ORAL_TABLET | Freq: Two times a day (BID) | ORAL | 0 refills | Status: DC

## 2016-08-27 NOTE — Telephone Encounter (Signed)
Refill x1 

## 2016-08-27 NOTE — Telephone Encounter (Signed)
Left message on machine that rx is ready for pickup

## 2016-08-27 NOTE — Telephone Encounter (Signed)
Self.   Refill for Adderall    [phone]

## 2016-08-27 NOTE — Telephone Encounter (Signed)
Refill Request: Adderall  Last RX: 07/24/16 Last OV: 03/09/16 Next OV: None scheduled UDS:07/26/16 CSC: 03/23/16

## 2016-09-27 ENCOUNTER — Telehealth: Payer: Self-pay | Admitting: Family

## 2016-09-27 NOTE — Telephone Encounter (Signed)
°  Relation to pt: self  Call back [place]   Reason for call:  Patient requesting amphetamine-dextroamphetamine (ADDERALL) 20 MG tablet

## 2016-09-28 MED ORDER — AMPHETAMINE-DEXTROAMPHETAMINE 20 MG PO TABS: 20.0000 mg | ORAL_TABLET | Freq: Two times a day (BID) | ORAL | 0 refills | Status: DC

## 2016-09-28 NOTE — Telephone Encounter (Signed)
Rx placed at front desk and my chart message sent to pt.

## 2016-09-28 NOTE — Telephone Encounter (Signed)
Last RX:  08/27/16, #60 x no refills Last OV:  03/09/16 Next OV:  Was due on 09/06/16 UDS:  07/26/16 and due 10/26/16.   Rx printed and forwarded to PCP for signature.. Pt will need to schedule f/u when he picks up Rx.

## 2016-10-03 NOTE — Telephone Encounter (Signed)
Pt called in to check on refill request. Advised pt that it is up front for pick up. He will be in as soon as possible for pick up.

## 2016-10-12 ENCOUNTER — Ambulatory Visit: Payer: BLUE CROSS/BLUE SHIELD | Admitting: Family

## 2016-10-19 ENCOUNTER — Ambulatory Visit (INDEPENDENT_AMBULATORY_CARE_PROVIDER_SITE_OTHER): Payer: BLUE CROSS/BLUE SHIELD | Admitting: Family

## 2016-10-19 ENCOUNTER — Encounter: Payer: Self-pay | Admitting: Family

## 2016-10-19 VITALS — BP 124/76 | HR 62 | Temp 98.5°F | Resp 18 | Ht 72.0 in | Wt 229.8 lb

## 2016-10-19 DIAGNOSIS — G4733 Obstructive sleep apnea (adult) (pediatric): Secondary | ICD-10-CM

## 2016-10-19 DIAGNOSIS — F909 Attention-deficit hyperactivity disorder, unspecified type: Secondary | ICD-10-CM

## 2016-10-19 MED ORDER — AMPHETAMINE-DEXTROAMPHETAMINE 20 MG PO TABS: 20.0000 mg | ORAL_TABLET | Freq: Two times a day (BID) | ORAL | 0 refills | Status: DC

## 2016-10-19 NOTE — Progress Notes (Signed)
Subjective:    Patient ID: Joel [name], male    DOB: 1985/12/02, 31 y.o.   MRN: [id number]  HPI  Mr.  Buffa is a 31 yr old male who presents today for follow up.  ADHD-Patient continues adderall. He reports good concentration on current dosing. 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  His work is also going well.  OSA-  he reports that his CPAP machine was taken back by the company due to issues with their record of his compliance. He reports that he had issues with machine turning off in the middle of the night and was trying to be compliant. He is currently without a machine and O's the company $300. He reports he was not contacted about his referral to orthodontist or for a oral appliance. He is willing to proceed with this referral but wants to pay off his CPAP first. He denies daytime somnolence   Review of Systems See HPI  Past Medical History:  Diagnosis Date  . ADD (attention deficit disorder)   . Hyperlipemia   . Obstructive sleep apnea 09/10/2013   OSA per split night study 10/15, settings 12 cm h20 full face mask.     . Tobacco abuse      Social History   Social History  . Marital status: Married    Spouse name: N/A  . Number of children: N/A  . Years of education: N/A   Occupational History  . Not on file.   Social History Main Topics  . Smoking status: Current Every Day Smoker    Packs/day: 0.50  . Smokeless tobacco: Never Used     Comment: <1/2 pack  per day  . Alcohol use Yes     Comment: case a beer monthly   . Drug use: No  . Sexual activity: Yes   Other Topics Concern  . Not on file   Social History Narrative   Works for [employer]   + tobacco abuse   2 children- daughter age 73 and daughter age 19   Married   Grew up in [place], moved here at age 89   Completed high school   Enjoys outdoor activities   Working on Restaurant manager, fast food          Past Surgical History:  Procedure  Laterality Date  . KNEE SURGERY Right 12/29/13  . NO PAST SURGERIES     denies surgical history  . WISDOM TOOTH EXTRACTION  01/18/2016    Family History  Problem Relation Age of Onset  . Hypertension Mother   . Healthy Father   . Healthy Brother   . Sudden death Neg Hx   . Hyperlipidemia Neg Hx   . Heart attack Neg Hx   . Diabetes Neg Hx     Allergies  Allergen Reactions  . Bee Venom Shortness Of Breath    Yellow Jacket  . Penicillins     Mother allergic to it. Pt never taken it.    Current Outpatient Prescriptions on File Prior to Visit  Medication Sig Dispense Refill  . EPINEPHrine 0.3 mg/0.3 mL IJ SOAJ injection Inject 0.3 mLs (0.3 mg total) into the muscle once. 2 Device 1   No current facility-administered medications on file prior to visit.     BP 124/76 (BP Location: Left Arm, Cuff Size: Large)   Pulse 62   Temp 98.5 F (36.9 C) (Oral)   Resp 18   Ht 6' (1.829 m)  Wt 229 lb 12.8 oz (104.2 kg)   SpO2 98%   BMI 31.17 kg/m       Objective:   Physical Exam  Constitutional: He is oriented to person, place, and time. He appears well-developed and well-nourished. No distress.  HENT:  Head: Normocephalic and atraumatic.  Cardiovascular: Normal rate and regular rhythm.   No murmur heard. Pulmonary/Chest: Effort normal and breath sounds normal. No respiratory distress. He has no wheezes. He has no rales.  Musculoskeletal: He exhibits no edema.  Neurological: He is alert and oriented to person, place, and time.  Skin: Skin is warm and dry.  Psychiatric: He has a normal mood and affect. His behavior is normal. Thought content normal.          Assessment & Plan:

## 2016-10-19 NOTE — Assessment & Plan Note (Signed)
He is advised to let me know when he is ready to proceed with referral to orthodontics and I will arrange.

## 2016-10-19 NOTE — Patient Instructions (Signed)
Please complete lab work prior to leaving.  Let me know when you are ready to see the orthodontis.

## 2016-10-19 NOTE — Assessment & Plan Note (Signed)
Stable on current dose of Adderall. Refill provided. Will obtain urine drug screen per protocol today.

## 2016-11-09 ENCOUNTER — Telehealth: Payer: Self-pay | Admitting: *Deleted

## 2016-11-09 NOTE — Telephone Encounter (Signed)
Received fax stating patient "has Declined an oral appliance theraoy for snoring/OSA; forwarded to provider/SLS 10/12

## 2016-12-04 ENCOUNTER — Telehealth: Payer: Self-pay | Admitting: Family

## 2016-12-04 MED ORDER — AMPHETAMINE-DEXTROAMPHETAMINE 20 MG PO TABS: 20.0000 mg | ORAL_TABLET | Freq: Two times a day (BID) | ORAL | 0 refills | Status: DC

## 2016-12-04 NOTE — Telephone Encounter (Signed)
Last RX: 10/19/16, #60 Last OV: 10/19/16 Next OV: due 04/18/17 UDS: 10/26/16, moderate. CSC:  03/23/16.  Rx printed and forwarded to PCP for signature.

## 2016-12-04 NOTE — Telephone Encounter (Signed)
Patient requesting adderal refill. Please call patient when ready for pick up.

## 2016-12-05 NOTE — Telephone Encounter (Signed)
Rx placed at front desk and detailed message left on pt's voicemail.

## 2017-01-03 ENCOUNTER — Telehealth: Payer: Self-pay | Admitting: Family

## 2017-01-03 NOTE — Telephone Encounter (Signed)
Adderall refill. Last filled on 02/04/16. Last OV 09/30/16

## 2017-01-03 NOTE — Telephone Encounter (Signed)
Copied from [place] [id number]. Topic: Quick Communication - See Telephone Encounter >> Jan 03, 2017 11:44 AM Synthia Innocent wrote: CRM for notification. See Telephone encounter for:  Requesting refill on adderall 01/03/17.

## 2017-01-04 MED ORDER — AMPHETAMINE-DEXTROAMPHETAMINE 20 MG PO TABS: 20.0000 mg | ORAL_TABLET | Freq: Two times a day (BID) | ORAL | 0 refills | Status: DC

## 2017-01-04 NOTE — Telephone Encounter (Signed)
Refill sent.  Please ask him to come to the lab for UDS.

## 2017-01-04 NOTE — Telephone Encounter (Signed)
Last RX:  12/04/16, #60 Last OV: 10/19/16 Next OV: due 04/18/17 UDS: was ordered on 10/19/16 but NOT collected. CSC: 03/23/16 CS Registry:  Reviewed and no discrepancies identified.

## 2017-01-04 NOTE — Telephone Encounter (Signed)
Notified pt and he voices understanding. 

## 2017-01-25 ENCOUNTER — Other Ambulatory Visit: Payer: BLUE CROSS/BLUE SHIELD

## 2017-01-25 DIAGNOSIS — Z79899 Other long term (current) drug therapy: Secondary | ICD-10-CM | POA: Diagnosis not present

## 2017-01-27 LAB — PAIN MGMT, PROFILE 8 W/CONF, U
6 Acetylmorphine: NEGATIVE ng/mL (ref <10)
Alcohol Metabolites: NEGATIVE ng/mL (ref <500)
Amphetamine: 27534 ng/mL — ABNORMAL HIGH (ref <250)
Amphetamines: POSITIVE ng/mL — AB (ref <500)
Benzodiazepines: NEGATIVE ng/mL (ref <100)
Buprenorphine, Urine: NEGATIVE ng/mL (ref <5)
Cocaine Metabolite: NEGATIVE ng/mL (ref <150)
Creatinine: 201.9 mg/dL
MDMA: NEGATIVE ng/mL (ref <500)
Marijuana Metabolite: NEGATIVE ng/mL (ref <20)
Methamphetamine: NEGATIVE ng/mL (ref <250)
Opiates: NEGATIVE ng/mL (ref <100)
Oxidant: NEGATIVE ug/mL (ref <200)
Oxycodone: NEGATIVE ng/mL (ref <100)
pH: 6.8 (ref 4.5–9.0)

## 2017-02-05 ENCOUNTER — Telehealth: Payer: Self-pay | Admitting: Family

## 2017-02-05 MED ORDER — AMPHETAMINE-DEXTROAMPHETAMINE 20 MG PO TABS: 20.0000 mg | ORAL_TABLET | Freq: Two times a day (BID) | ORAL | 0 refills | Status: DC

## 2017-02-05 NOTE — Telephone Encounter (Signed)
Last RX:  01/04/17, #60 Last OV: 10/19/16 Next OV:  Due 04/18/16 UDS: 01/25/17 CSC: 03/23/16 CSR: No discrepancies identified

## 2017-02-05 NOTE — Telephone Encounter (Signed)
Requesting refill of Adderall  LOV 10/19/16 with M. O'Sullivan,NP

## 2017-02-05 NOTE — Telephone Encounter (Signed)
Copied from [place] [id number]. Topic: Quick Communication - Rx Refill/Question >> Feb 05, 2017 12:25 PM [name], Joel Emperor wrote: Has the patient contacted their pharmacy? No.   (Agent: If no, request that the patient contact the pharmacy for the refill.)   Preferred Pharmacy (with phone number or street name): [facility] - [place], [place] - [address] AT [place] [phone] (Phone) [phone] (Fax)  Pt is requesting a med refill on his amphetamine-dextroamphetamine (ADDERALL) 20 MG tablet [[id number]]    Agent: Please be advised that RX refills may take up to 3 business days. We ask that you follow-up with your pharmacy.

## 2017-03-08 ENCOUNTER — Other Ambulatory Visit: Payer: Self-pay | Admitting: Family

## 2017-03-08 MED ORDER — AMPHETAMINE-DEXTROAMPHETAMINE 20 MG PO TABS: 20.0000 mg | ORAL_TABLET | Freq: Two times a day (BID) | ORAL | 0 refills | Status: DC

## 2017-03-08 NOTE — Telephone Encounter (Signed)
Copied from [place] [id number]. Topic: Quick Communication - Rx Refill/Question >> Mar 08, 2017 10:07 AM Arletha Grippe wrote: Medication: adderrall    Has the patient contacted their pharmacy? No.   (Agent: If no, request that the patient contact the pharmacy for the refill.)   Preferred Pharmacy (with phone number or street name): call to pick up [phone]   Agent: Please be advised that RX refills may take up to 3 business days. We ask that you follow-up with your pharmacy.

## 2017-03-08 NOTE — Telephone Encounter (Signed)
Rx signed and placed at front desk for pick up. Message sent to pt.

## 2017-03-08 NOTE — Telephone Encounter (Signed)
Joel [name]-- I printed Rx because it would be best for pt to pick it up and update his controlled substance contract. He is due for f/u in March and we will schedule that when he comes to pick up Rx.  Last Adderall RX: 02/05/17, #60 Last OV: 10/19/16 Next OV: due 04/18/17 UDS: 01/25/17 CSC: 03/23/16, Needs to update. CSR: No discrepancies identified

## 2017-03-19 ENCOUNTER — Telehealth: Payer: Self-pay | Admitting: *Deleted

## 2017-03-19 NOTE — Telephone Encounter (Signed)
Received completed CSC; forwarded to provider/SLS 02/19

## 2017-04-10 ENCOUNTER — Other Ambulatory Visit: Payer: Self-pay | Admitting: Family

## 2017-04-10 NOTE — Telephone Encounter (Signed)
LOV  10/19/16 Melissa O'Sullivan [facility] [address]

## 2017-04-10 NOTE — Telephone Encounter (Signed)
Copied from [place] [id number] [phone]. Topic: Quick Communication - See Telephone Encounter >> Apr 10, 2017 11:00 AM Ahmed Prima L wrote: CRM for notification. See Telephone encounter for:   04/10/17.  amphetamine-dextroamphetamine (ADDERALL) 20 MG tablet  [facility] - [place], [place] - [address] AT [address]

## 2017-04-10 NOTE — Telephone Encounter (Signed)
Requesting:Adderall Contract:03/08/17 UDS:01/25/17 moderate risk Last Visit:10/19/16 Next Visit:04/19/17 Last Refill:03/08/17  Please Advise

## 2017-04-11 ENCOUNTER — Other Ambulatory Visit: Payer: Self-pay | Admitting: Family

## 2017-04-11 MED ORDER — AMPHETAMINE-DEXTROAMPHETAMINE 20 MG PO TABS: 20.0000 mg | ORAL_TABLET | Freq: Two times a day (BID) | ORAL | 0 refills | Status: DC

## 2017-04-19 ENCOUNTER — Ambulatory Visit: Payer: BLUE CROSS/BLUE SHIELD | Admitting: Family

## 2017-05-06 ENCOUNTER — Encounter: Payer: Self-pay | Admitting: Family

## 2017-05-06 ENCOUNTER — Ambulatory Visit: Payer: BLUE CROSS/BLUE SHIELD | Admitting: Family

## 2017-05-06 VITALS — BP 115/73 | HR 71 | Temp 97.7°F | Resp 16 | Ht 72.0 in | Wt 227.6 lb

## 2017-05-06 DIAGNOSIS — Z79899 Other long term (current) drug therapy: Secondary | ICD-10-CM | POA: Diagnosis not present

## 2017-05-06 DIAGNOSIS — F909 Attention-deficit hyperactivity disorder, unspecified type: Secondary | ICD-10-CM | POA: Diagnosis not present

## 2017-05-06 MED ORDER — AMPHETAMINE-DEXTROAMPHETAMINE 20 MG PO TABS: 20.0000 mg | ORAL_TABLET | Freq: Two times a day (BID) | ORAL | 0 refills | Status: DC

## 2017-05-06 NOTE — Patient Instructions (Signed)
Please stop in the lab for urine test. Schedule fasting physical with Melissa in 3 months. Congratulations on your baby boy!

## 2017-05-06 NOTE — Progress Notes (Signed)
Subjective:    Patient ID: Joel [name], male    DOB: 1986/01/09, 32 y.o.   MRN: [id number]  HPI  Patient is a 32 yr old male who presents today for follow up of his ADHD. Reports that he is getting his work done. Staying organized. Tolerating adderall. Denies side effects.   Has a new 12 week old son who he is enjoying.   Review of Systems See HPI  Past Medical History:  Diagnosis Date  . ADD (attention deficit disorder)   . Hyperlipemia   . Obstructive sleep apnea 09/10/2013   OSA per split night study 10/15, settings 12 cm h20 full face mask.     . Tobacco abuse      Social History   Socioeconomic History  . Marital status: Married    Spouse name: Not on file  . Number of children: Not on file  . Years of education: Not on file  . Highest education level: Not on file  Occupational History  . Not on file  Social Needs  . Financial resource strain: Not on file  . Food insecurity:    Worry: Not on file    Inability: Not on file  . Transportation needs:    Medical: Not on file    Non-medical: Not on file  Tobacco Use  . Smoking status: Current Every Day Smoker    Packs/day: 0.50  . Smokeless tobacco: Never Used  . Tobacco comment: <1/2 pack  per day  Substance and Sexual Activity  . Alcohol use: Yes    Comment: case a beer monthly   . Drug use: No    Frequency: 7.0 times per week  . Sexual activity: Yes  Lifestyle  . Physical activity:    Days per week: Not on file    Minutes per session: Not on file  . Stress: Not on file  Relationships  . Social connections:    Talks on phone: Not on file    Gets together: Not on file    Attends religious service: Not on file    Active member of club or organization: Not on file    Attends meetings of clubs or organizations: Not on file    Relationship status: Not on file  . Intimate partner violence:    Fear of current or ex partner: Not on file    Emotionally abused: Not on file    Physically abused: Not on  file    Forced sexual activity: Not on file  Other Topics Concern  . Not on file  Social History Narrative   Works for [employer]   + tobacco abuse   2 children- daughter age 51 and daughter age 67   Married   Grew up in [place], moved here at age 71   Completed high school   Enjoys outdoor activities   Working on Restaurant manager, fast food       Past Surgical History:  Procedure Laterality Date  . KNEE SURGERY Right 12/29/13  . NO PAST SURGERIES     denies surgical history  . WISDOM TOOTH EXTRACTION  01/18/2016    Family History  Problem Relation Age of Onset  . Hypertension Mother   . Healthy Father   . Healthy Brother   . Sudden death Neg Hx   . Hyperlipidemia Neg Hx   . Heart attack Neg Hx   . Diabetes Neg Hx     Allergies  Allergen Reactions  . Bee Venom Shortness Of  Breath    Yellow Jacket  . Penicillins     Mother allergic to it. Pt never taken it.    Current Outpatient Medications on File Prior to Visit  Medication Sig Dispense Refill  . amphetamine-dextroamphetamine (ADDERALL) 20 MG tablet Take 1 tablet (20 mg total) by mouth 2 (two) times daily. 60 tablet 0  . EPINEPHrine 0.3 mg/0.3 mL IJ SOAJ injection Inject 0.3 mLs (0.3 mg total) into the muscle once. 2 Device 1   No current facility-administered medications on file prior to visit.     BP 115/73 (BP Location: Left Arm, Cuff Size: Large)   Pulse 71   Temp 97.7 F (36.5 C) (Oral)   Resp 16   Ht 6' (1.829 m)   Wt 227 lb 9.6 oz (103.2 kg)   SpO2 97%   BMI 30.87 kg/m       Objective:   Physical Exam  Constitutional: He is oriented to person, place, and time. He appears well-developed and well-nourished. No distress.  HENT:  Head: Normocephalic and atraumatic.  Cardiovascular: Normal rate and regular rhythm.  No murmur heard. Pulmonary/Chest: Effort normal and breath sounds normal. No respiratory distress. He has no wheezes. He has no rales.  Musculoskeletal: He  exhibits no edema.  Neurological: He is alert and oriented to person, place, and time.  Skin: Skin is warm and dry.  Psychiatric: He has a normal mood and affect. His behavior is normal. Thought content normal.          Assessment & Plan:  ADHD- clinically stable. A new controlled substance contract is signed. Will obtain a uds. Refill sent to his pharmacy for adderall. Reviewed [place] controlled substance registry and refills are appropriate.

## 2017-05-08 LAB — PAIN MGMT, PROFILE 8 W/CONF, U
6 Acetylmorphine: NEGATIVE ng/mL (ref <10)
Alcohol Metabolites: NEGATIVE ng/mL (ref <500)
Amphetamine: 9725 ng/mL — ABNORMAL HIGH (ref <250)
Amphetamines: POSITIVE ng/mL — AB (ref <500)
Benzodiazepines: NEGATIVE ng/mL (ref <100)
Buprenorphine, Urine: NEGATIVE ng/mL (ref <5)
Cocaine Metabolite: NEGATIVE ng/mL (ref <150)
Creatinine: 212.7 mg/dL
MDMA: NEGATIVE ng/mL (ref <500)
Marijuana Metabolite: NEGATIVE ng/mL (ref <20)
Methamphetamine: NEGATIVE ng/mL (ref <250)
Opiates: NEGATIVE ng/mL (ref <100)
Oxidant: NEGATIVE ug/mL (ref <200)
Oxycodone: NEGATIVE ng/mL (ref <100)
pH: 7.63 (ref 4.5–9.0)

## 2017-06-14 ENCOUNTER — Other Ambulatory Visit: Payer: Self-pay | Admitting: Family

## 2017-06-14 MED ORDER — AMPHETAMINE-DEXTROAMPHETAMINE 20 MG PO TABS: 20.0000 mg | ORAL_TABLET | Freq: Two times a day (BID) | ORAL | 0 refills | Status: DC

## 2017-06-14 NOTE — Telephone Encounter (Signed)
Copied from [place] [id number]. Topic: Quick Communication - Rx Refill/Question >> Jun 14, 2017  9:37 AM Cleaster Corin, NT wrote: Medication:[facility] - [place], [place] - [address] AT [facility] [address] [place] [address] Phone: [phone] Fax: [phone]   Has the patient contacted their pharmacy?yes (Agent: If no, request that the patient contact the pharmacy for the refill.) Preferred Pharmacy (with phone number or street name): [facility] - [place],  - [address] AT [place] [address] [place] [address] Phone: [phone] Fax: [phone]   Agent: Please be advised that RX refills may take up to 3 business days. We ask that you follow-up with your pharmacy.

## 2017-06-14 NOTE — Telephone Encounter (Signed)
Adderall refill Last OV:05/06/17 Last refill:05/06/17 60 tab/0 refill PCP:O'Sullivan Pharmacy: [place], [place] - [address] AT [place] [phone] (Phone) [phone] (Fax)

## 2017-06-14 NOTE — Telephone Encounter (Signed)
Last  adderall RX:05/06/17, #60 Last OV: 05/06/17 Next OV: due 08/05/17 for CPE; not yet scheduled UDS: 05/06/17 CSC: 05/06/17 CSR: No discrepancies identified

## 2017-07-17 ENCOUNTER — Other Ambulatory Visit: Payer: Self-pay | Admitting: Family

## 2017-07-17 NOTE — Telephone Encounter (Signed)
Adderall refill Last Refill:06/14/17 #60 Last OV: 05/06/17 PCP: Felida: [name]

## 2017-07-17 NOTE — Telephone Encounter (Signed)
Last adderall RX: 06/14/17, #60 Last OV: 05/06/17 Next OV: not scheduled but due for CPE on 08/05/17 UDS: 05/06/17 CSC: 05/06/17 CSR: No discrepancies identified

## 2017-07-17 NOTE — Telephone Encounter (Signed)
Copied from [place] [phone]. Topic: Quick Communication - Rx Refill/Question >> Jul 17, 2017 10:38 AM Oliver Pila B wrote: Medication: amphetamine-dextroamphetamine (ADDERALL) 20 MG tablet [[id number]]    Has the patient contacted their pharmacy? Yes.   (Agent: If no, request that the patient contact the pharmacy for the refill.) (Agent: If yes, when and what did the pharmacy advise?)  [facility]  Agent: Please be advised that RX refills may take up to 3 business days. We ask that you follow-up with your pharmacy.

## 2017-07-18 MED ORDER — AMPHETAMINE-DEXTROAMPHETAMINE 20 MG PO TABS: 20.0000 mg | ORAL_TABLET | Freq: Two times a day (BID) | ORAL | 0 refills | Status: DC

## 2017-08-15 ENCOUNTER — Other Ambulatory Visit: Payer: Self-pay | Admitting: Family

## 2017-08-15 NOTE — Telephone Encounter (Signed)
Adderall refill Last Refill:07/18/17 #60 Last OV: 05/06/17 PCP: [name] [facility]acy:[facility] [address]

## 2017-08-15 NOTE — Telephone Encounter (Signed)
Copied from [place] [phone]. Topic: Quick Communication - See Telephone Encounter >> Aug 15, 2017  9:06 AM Mylinda Latina, NT wrote: CRM for notification. See Telephone encounter for: 08/15/17. Patient called and states he needs a refill of his amphetamine-dextroamphetamine (ADDERALL) 20 MG tablet  [id number] - [place], [place] - [address] AT [address] [phone] (Phone) [phone] (Fax)

## 2017-08-15 NOTE — Telephone Encounter (Signed)
Last adderall RX: 07/18/17, #60 Last OV: 05/06/17 Next OV: due for cpe in July but has not scheduled UDS: 05/06/17 CSC: 05/06/17 CSR: No discrepancies identified

## 2017-08-16 MED ORDER — AMPHETAMINE-DEXTROAMPHETAMINE 20 MG PO TABS: 20.0000 mg | ORAL_TABLET | Freq: Two times a day (BID) | ORAL | 0 refills | Status: DC

## 2017-09-17 ENCOUNTER — Other Ambulatory Visit: Payer: Self-pay | Admitting: Family

## 2017-09-17 NOTE — Telephone Encounter (Signed)
Copied from [place] [phone]. Topic: Quick Communication - Rx Refill/Question >> Sep 17, 2017  8:58 AM Synthia Innocent wrote: Medication: amphetamine-dextroamphetamine (ADDERALL) 20 MG tablet   Has the patient contacted their pharmacy? Yes.   (Agent: If no, request that the patient contact the pharmacy for the refill.) (Agent: If yes, when and what did the pharmacy advise?)  Preferred Pharmacy (with phone number [facility] street [facility] [address]  Agent: Please be advised that RX refills may take up to 3 business days. We ask that you follow-up with your pharmacy.

## 2017-09-17 NOTE — Telephone Encounter (Signed)
Refill of Adderal  LRF 08/16/17  #60  0 refills  LOV 05/06/17 M. Kerby [facility] on [address]

## 2017-09-18 MED ORDER — AMPHETAMINE-DEXTROAMPHETAMINE 20 MG PO TABS: 20.0000 mg | ORAL_TABLET | Freq: Two times a day (BID) | ORAL | 0 refills | Status: DC

## 2017-09-18 NOTE — Telephone Encounter (Signed)
Refilled Adderal for pt. Pcp not in office this week.

## 2017-10-21 ENCOUNTER — Other Ambulatory Visit: Payer: Self-pay

## 2017-10-21 NOTE — Telephone Encounter (Signed)
Copied from [place] [phone]. Topic: Quick Communication - Rx Refill/Question >> Oct 21, 2017  4:08 PM Carolyn Stare wrote: Medication          amphetamine-dextroamphetamine (ADDERALL) 20 MG tablet  Preferred Pharmacy  [place] [facility]  [address]    Agent: Please be advised that RX refills may take up to 3 business days. We ask that you follow-up with your pharmacy.

## 2017-10-23 NOTE — Telephone Encounter (Signed)
Pt is requesting refill on Adderall.   Last OV: 05/06/2017 Last Fill: 09/18/2017 #60 and 0RF UDS: 05/06/2017 Low risk

## 2017-10-24 MED ORDER — AMPHETAMINE-DEXTROAMPHETAMINE 20 MG PO TABS: 20.0000 mg | ORAL_TABLET | Freq: Two times a day (BID) | ORAL | 0 refills | Status: DC

## 2017-11-25 ENCOUNTER — Other Ambulatory Visit: Payer: Self-pay | Admitting: Family

## 2017-11-25 NOTE — Telephone Encounter (Signed)
Copied from [place] [phone]. Topic: Quick Communication - Rx Refill/Question >> Nov 25, 2017  4:43 PM Nils Flack, Marland Kitchen wrote: Medication: amphetamine-dextroamphetamine (ADDERALL) 20 MG tablet Has the patient contacted their pharmacy? No. (Agent: If no, request that the patient contact the pharmacy for the refill.) (Agent: If yes, when and what did the pharmacy advise?)  Preferred Pharmacy (with phone number [facility] street name[facility] [address]   Agent: Please be advised that RX refills may take up to 3 business days. We ask that you follow-up with your pharmacy.

## 2017-11-26 NOTE — Telephone Encounter (Signed)
Requested medication (s) are due for refill today: yes  Requested medication (s) are on the active medication list: yes  Last refill:  10/24/17 #60  Future visit scheduled: No  Notes to clinic:  LOV: 05/06/17    Requested Prescriptions  Pending Prescriptions Disp Refills   amphetamine-dextroamphetamine (ADDERALL) 20 MG tablet 60 tablet 0    Sig: Take 1 tablet (20 mg total) by mouth 2 (two) times daily.     Not Delegated - Psychiatry:  Stimulants/ADHD Failed - 11/25/2017  5:45 PM      Failed - This refill cannot be delegated      Failed - Urine Drug Screen completed in last 360 days.      Failed - Valid encounter within last 3 months    Recent Outpatient Visits          6 months ago Attention deficit hyperactivity disorder (ADHD), unspecified ADHD type   [facility] [place], [facility]   1 year ago Attention deficit hyperactivity disorder (ADHD), unspecified ADHD type   [facility] [place], NP   1 year ago Preventative health care   [place] [place], NP   2 years ago [facility] [place], NP   2 years ago Routine general medical examination at a health care facility   [facility] [place], [place]

## 2017-11-26 NOTE — Telephone Encounter (Signed)
Pt is requesting refill on Adderall.   Last OV: 05/06/2017 Last Fill: 10/24/2017 #60 and 0RF UDS: 05/06/2017

## 2017-11-27 MED ORDER — AMPHETAMINE-DEXTROAMPHETAMINE 20 MG PO TABS: 20.0000 mg | ORAL_TABLET | Freq: Two times a day (BID) | ORAL | 0 refills | Status: DC

## 2017-12-31 ENCOUNTER — Other Ambulatory Visit: Payer: Self-pay | Admitting: Family

## 2017-12-31 NOTE — Telephone Encounter (Signed)
Copied from [place] [phone]. Topic: Quick Communication - Rx Refill/Question >> Dec 31, 2017  8:31 AM Bea Graff, NT wrote: Medication: amphetamine-dextroamphetamine (ADDERALL) 20 MG tablet  Has the patient contacted their pharmacy? Yes.   (Agent: If no, request that the patient contact the pharmacy for the refill.) (Agent: If yes, when and what did the pharmacy advise?) Advised pt to call PCP  Preferred Pharmacy (with phone number or street name): [id number] - [place], [place] - [address] [phone] (Phone) [phone] (Fax)    Agent: Please be advised that RX refills may take up to 3 business days. We ask that you follow-up with your pharmacy.

## 2018-01-02 ENCOUNTER — Telehealth: Payer: Self-pay | Admitting: Family

## 2018-01-02 MED ORDER — AMPHETAMINE-DEXTROAMPHETAMINE 20 MG PO TABS: 20.0000 mg | ORAL_TABLET | Freq: Two times a day (BID) | ORAL | 0 refills | Status: DC

## 2018-01-02 NOTE — Telephone Encounter (Signed)
Left detailed message on voicemail to call and schedule office visit before further refills are due.

## 2018-01-02 NOTE — Telephone Encounter (Signed)
Please advise pt that adderall rx has been sent to his pharmacy but he will need to be seen in the office prior to additional refills.

## 2018-01-06 ENCOUNTER — Ambulatory Visit: Payer: BLUE CROSS/BLUE SHIELD | Admitting: Family

## 2018-01-10 ENCOUNTER — Ambulatory Visit: Payer: BLUE CROSS/BLUE SHIELD | Admitting: Family

## 2018-01-10 VITALS — BP 138/94 | HR 80 | Temp 98.5°F | Resp 16 | Ht 72.0 in | Wt 231.0 lb

## 2018-01-10 DIAGNOSIS — R030 Elevated blood-pressure reading, without diagnosis of hypertension: Secondary | ICD-10-CM

## 2018-01-10 DIAGNOSIS — F909 Attention-deficit hyperactivity disorder, unspecified type: Secondary | ICD-10-CM

## 2018-01-10 NOTE — Progress Notes (Signed)
   Subjective:    Patient ID: Joel [name], male    DOB: 02-Jul-1985, 32 y.o.   MRN: [id number]  HPI   Patient is a 32 yr old male who presents today for routine follow up of his ADHD.  He continues adderall and reports feeling well on this dose. Reports that he is able to concentrate at work and complete tasks without difficulty.   Denies side effects. Feels well.  BP Readings from Last 3 Encounters:  01/10/18 (!) 138/94  05/06/17 115/73  10/19/16 124/76     Review of Systems     Objective:   Physical Exam Constitutional:      General: He is not in acute distress.    Appearance: He is well-developed.  HENT:     Head: Normocephalic and atraumatic.  Cardiovascular:     Rate and Rhythm: Normal rate and regular rhythm.     Heart sounds: No murmur.  Pulmonary:     Effort: Pulmonary effort is normal. No respiratory distress.     Breath sounds: Normal breath sounds. No wheezing or rales.  Skin:    General: Skin is warm and dry.  Neurological:     Mental Status: He is alert and oriented to person, place, and time.  Psychiatric:        Behavior: Behavior normal.        Thought Content: Thought content normal.           Assessment & Plan:  ADHD- stable on adderall, continue same.  Obtain follow up UDS. Contract is up to date.  Elevated blood pressure- will plan to recheck in 1 month. If still elevated may need to consider d/c of adderall or addition of antihypertensive.

## 2018-01-13 LAB — PAIN MGMT, PROFILE 8 W/CONF, U
6 Acetylmorphine: NEGATIVE ng/mL (ref <10)
Alcohol Metabolites: NEGATIVE ng/mL (ref <500)
Amphetamine: 8458 ng/mL — ABNORMAL HIGH (ref <250)
Amphetamines: POSITIVE ng/mL — AB (ref <500)
Benzodiazepines: NEGATIVE ng/mL (ref <100)
Buprenorphine, Urine: NEGATIVE ng/mL (ref <5)
Cocaine Metabolite: NEGATIVE ng/mL (ref <150)
Creatinine: 58 mg/dL
MDMA: NEGATIVE ng/mL (ref <500)
Marijuana Metabolite: NEGATIVE ng/mL (ref <20)
Methamphetamine: NEGATIVE ng/mL (ref <250)
Opiates: NEGATIVE ng/mL (ref <100)
Oxidant: NEGATIVE ug/mL (ref <200)
Oxycodone: NEGATIVE ng/mL (ref <100)
pH: 6.53 (ref 4.5–9.0)

## 2018-02-03 ENCOUNTER — Other Ambulatory Visit: Payer: Self-pay | Admitting: Family

## 2018-02-03 NOTE — Telephone Encounter (Signed)
Copied from [place] [phone]. Topic: Quick Communication - Rx Refill/Question >> Feb 03, 2018  1:10 PM Williams-Neal, Sade R wrote: Medication: amphetamine-dextroamphetamine (ADDERALL) 20 MG tablet  Has the patient contacted their pharmacy? Yes  Preferred Pharmacy (with phone number or street name): [id number] - [place], [place] - [address] AT [place] [phone] (Phone) [phone] (Fax)    Agent: Please be advised that RX refills may take up to 3 business days. We ask that you follow-up with your pharmacy.

## 2018-02-05 MED ORDER — AMPHETAMINE-DEXTROAMPHETAMINE 20 MG PO TABS: 20.0000 mg | ORAL_TABLET | Freq: Two times a day (BID) | ORAL | 0 refills | Status: DC

## 2018-02-11 ENCOUNTER — Encounter: Payer: BLUE CROSS/BLUE SHIELD | Admitting: Family

## 2018-03-07 ENCOUNTER — Other Ambulatory Visit: Payer: Self-pay | Admitting: Family

## 2018-03-07 NOTE — Telephone Encounter (Signed)
Copied from [place] [phone]. Topic: Quick Communication - Rx Refill/Question >> Mar 07, 2018  4:09 PM Rayann Heman wrote: Medication: amphetamine-dextroamphetamine (ADDERALL) 20 MG tablet [[id number]]    Has the patient contacted their pharmacy? no Preferred Pharmacy (with phone number or street name)[id number] - [place], [place] - [address] AT [place] [phone] (Phone) [phone] (Fax)  :   Agent: Please be advised that RX refills may take up to 3 business days. We ask that you follow-up with your pharmacy.

## 2018-03-11 MED ORDER — AMPHETAMINE-DEXTROAMPHETAMINE 20 MG PO TABS: 20.0000 mg | ORAL_TABLET | Freq: Two times a day (BID) | ORAL | 0 refills | Status: DC

## 2018-04-09 ENCOUNTER — Other Ambulatory Visit: Payer: Self-pay | Admitting: Family

## 2018-04-09 MED ORDER — AMPHETAMINE-DEXTROAMPHETAMINE 20 MG PO TABS: 20.0000 mg | ORAL_TABLET | Freq: Two times a day (BID) | ORAL | 0 refills | Status: DC

## 2018-04-09 NOTE — Telephone Encounter (Signed)
Copied from [place] [phone]. Topic: Quick Communication - See Telephone Encounter >> Apr 09, 2018 11:51 AM Ivar Drape wrote: CRM for notification. See Telephone encounter for: 04/09/18. Patient would like a refill on his amphetamine-dextroamphetamine (ADDERALL) 20 MG tablet medication and have it sent to his preferred pharmacy [facility] on [address] and [address].

## 2018-05-02 ENCOUNTER — Encounter: Payer: BLUE CROSS/BLUE SHIELD | Admitting: Family

## 2018-05-08 ENCOUNTER — Other Ambulatory Visit: Payer: Self-pay | Admitting: Family

## 2018-05-08 MED ORDER — AMPHETAMINE-DEXTROAMPHETAMINE 20 MG PO TABS: 20.0000 mg | ORAL_TABLET | Freq: Two times a day (BID) | ORAL | 0 refills | Status: DC

## 2018-05-08 NOTE — Telephone Encounter (Signed)
Requested medication (s) are due for refill today: Yes  Requested medication (s) are on the active medication list: Yes  Last refill:  04/09/18  Future visit scheduled: No  Notes to clinic:  See request    Requested Prescriptions  Pending Prescriptions Disp Refills   amphetamine-dextroamphetamine (ADDERALL) 20 MG tablet 60 tablet 0    Sig: Take 1 tablet (20 mg total) by mouth 2 (two) times daily.     Not Delegated - Psychiatry:  Stimulants/ADHD Failed - 05/08/2018  1:08 PM      Failed - This refill cannot be delegated      Failed - Valid encounter within last 3 months    Recent Outpatient Visits          3 months ago Attention deficit hyperactivity disorder (ADHD), unspecified ADHD type   [facility] [place], NP   1 year ago Attention deficit hyperactivity disorder (ADHD), unspecified ADHD type   [facility] [place], NP   1 year ago Attention deficit hyperactivity disorder (ADHD), unspecified ADHD type   [facility] [place], NP   2 years ago Preventative health care   [place] [facility] [place], NP   2 years ago [place] [facility] [place], NP             Passed - Urine Drug Screen completed in last 360 days.

## 2018-06-10 ENCOUNTER — Telehealth: Payer: Self-pay | Admitting: Family

## 2018-06-10 ENCOUNTER — Other Ambulatory Visit: Payer: Self-pay | Admitting: Family

## 2018-06-10 MED ORDER — AMPHETAMINE-DEXTROAMPHETAMINE 20 MG PO TABS: 20.0000 mg | ORAL_TABLET | Freq: Two times a day (BID) | ORAL | 0 refills | Status: DC

## 2018-06-10 NOTE — Telephone Encounter (Signed)
Please let pt know that I sent a refill on his adderall, but he will be due for follow up in June.

## 2018-06-10 NOTE — Telephone Encounter (Signed)
Copied from [place] [phone]. Topic: Quick Communication - Rx Refill/Question >> Jun 10, 2018  1:40 PM Reyne Dumas L wrote: Medication: amphetamine-dextroamphetamine (ADDERALL) 20 MG tablet  Has the patient contacted their pharmacy? Yes  (Agent: If no, request that the patient contact the pharmacy for the refill.) (Agent: If yes, when and what did the pharmacy advise?)  Preferred Pharmacy (with phone number or street name): [id number] - [place], [place] - [address] AT [place] [phone] (Phone) [phone] (Fax)  Agent: Please be advised that RX refills may take up to 3 business days. We ask that you follow-up with your pharmacy.

## 2018-06-11 NOTE — Telephone Encounter (Signed)
Left message to schedule follow up in June with pcp.

## 2018-07-10 ENCOUNTER — Other Ambulatory Visit: Payer: Self-pay | Admitting: Family

## 2018-07-10 NOTE — Telephone Encounter (Signed)
Copied from [place] [phone]. Topic: General - Other >> Jul 10, 2018 12:33 PM Lennox Solders wrote: Reason for CRM: pt is calling and needs a refill on generic adderall. [facility] [address]

## 2018-07-11 ENCOUNTER — Telehealth: Payer: Self-pay | Admitting: Family

## 2018-07-11 MED ORDER — AMPHETAMINE-DEXTROAMPHETAMINE 20 MG PO TABS: 20.0000 mg | ORAL_TABLET | Freq: Two times a day (BID) | ORAL | 0 refills | Status: DC

## 2018-07-11 NOTE — Telephone Encounter (Signed)
Please contact pt to arrange a virtual follow up visit.

## 2018-07-14 ENCOUNTER — Ambulatory Visit (INDEPENDENT_AMBULATORY_CARE_PROVIDER_SITE_OTHER): Payer: BC Managed Care – PPO | Admitting: Family

## 2018-07-14 ENCOUNTER — Encounter: Payer: Self-pay | Admitting: Family

## 2018-07-14 ENCOUNTER — Other Ambulatory Visit: Payer: Self-pay

## 2018-07-14 DIAGNOSIS — R030 Elevated blood-pressure reading, without diagnosis of hypertension: Secondary | ICD-10-CM

## 2018-07-14 DIAGNOSIS — F909 Attention-deficit hyperactivity disorder, unspecified type: Secondary | ICD-10-CM

## 2018-07-14 NOTE — Progress Notes (Signed)
Virtual Visit via Video Note  I connected with Joel [name] on 07/14/18 at 12:00 PM EDT by a video enabled telemedicine application and verified that I am speaking with the correct person using two identifiers.  Location: Patient: work Provider: home   I discussed the limitations of evaluation and management by telemedicine and the availability of in person appointments. The patient expressed understanding and agreed to proceed.  History of Present Illness:  Patient is a 33 yr old male who presents today for follow up of his ADHD.  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    Reports last weight is 225 lbs.  Patient reports that he has been exercising more frequently.   Wt Readings from Last 3 Encounters:  01/10/18 231 lb (104.8 kg)  05/06/17 227 lb 9.6 oz (103.2 kg)  10/19/16 229 lb 12.8 oz (104.2 kg)   Reports home bp readings have been stable, wife has been checking his blood pressure regularly.   ADHD- reports that his concentration remains good on adderall.     Observations/Objective:   Gen: Awake, alert, no acute distress Resp: Breathing is even and non-labored Psych: calm/pleasant demeanor Neuro: Alert and Oriented x 3, + facial symmetry, speech is clear.  Assessment and Plan:  ADHD- stable on adderall. Continue same.    Elevated blood pressure reading- Pt reports home bp readings have been WNL. Plan to repeat bp in the office in 3 months.  BP Readings from Last 3 Encounters:  01/10/18 (!) 138/94  05/06/17 115/73  10/19/16 124/76    Follow Up Instructions:    I discussed the assessment and treatment plan with the patient. The patient was provided an opportunity to ask questions and all were answered. The patient agreed with the plan and demonstrated an understanding of the instructions.   The patient was advised to call back or seek an in-person evaluation if the symptoms worsen  or if the condition fails to improve as anticipated.   Nance Pear, NP

## 2018-07-14 NOTE — Telephone Encounter (Signed)
I saw the pt today

## 2018-08-08 ENCOUNTER — Other Ambulatory Visit: Payer: Self-pay | Admitting: Family

## 2018-08-08 NOTE — Telephone Encounter (Signed)
Medication: amphetamine-dextroamphetamine (ADDERALL) 20 MG tablet [[id number]]   Has the patient contacted their pharmacy? Yes  (Agent: If no, request that the patient contact the pharmacy for the refill.) (Agent: If yes, when and what did the pharmacy advise?)  Preferred Pharmacy (with phone number or street name): [facility][id number] - [place], [place] - [address] AT [place] [phone] (Phone) [phone] (Fax)    Agent: Please be advised that RX refills may take up to 3 business days. We ask that you follow-up with your pharmacy.

## 2018-08-10 MED ORDER — AMPHETAMINE-DEXTROAMPHETAMINE 20 MG PO TABS: 20.0000 mg | ORAL_TABLET | Freq: Two times a day (BID) | ORAL | 0 refills | Status: DC

## 2018-09-10 ENCOUNTER — Other Ambulatory Visit: Payer: Self-pay | Admitting: Family

## 2018-09-10 NOTE — Telephone Encounter (Signed)
Medication Refill - Medication:  amphetamine-dextroamphetamine (ADDERALL) 20 MG tablet  Has the patient contacted their pharmacy? Yes advised to call PCP.   Preferred Pharmacy (with phone number or street name):  [facility][id number] - [place], [place] - [address] [phone] (Phone) [phone] (Fax)   Agent: Please be advised that RX refills may take up to 3 business days. We ask that you follow-up with your pharmacy.

## 2018-09-11 MED ORDER — AMPHETAMINE-DEXTROAMPHETAMINE 20 MG PO TABS: 20.0000 mg | ORAL_TABLET | Freq: Two times a day (BID) | ORAL | 0 refills | Status: DC

## 2018-10-10 ENCOUNTER — Telehealth: Payer: Self-pay | Admitting: Family

## 2018-10-10 MED ORDER — AMPHETAMINE-DEXTROAMPHETAMINE 20 MG PO TABS: 20.0000 mg | ORAL_TABLET | Freq: Two times a day (BID) | ORAL | 0 refills | Status: DC

## 2018-10-10 NOTE — Telephone Encounter (Signed)
Requesting:Adderall 20mg   Contract:05/10/2017 needs updated csc UDS:01/10/2018 Last Visit:07/14/2018 Next Visit:10/17/2018 Last Refill:09/11/2018  Please Advise

## 2018-10-10 NOTE — Telephone Encounter (Signed)
Pt request refill  amphetamine-dextroamphetamine (ADDERALL) 20 MG tablet  [id number] - [place], [place] - [address] [place] [address] [phone] (Phone) [phone] (Fax)

## 2018-10-17 ENCOUNTER — Ambulatory Visit: Payer: BC Managed Care – PPO | Admitting: Family

## 2018-10-31 ENCOUNTER — Ambulatory Visit: Payer: BC Managed Care – PPO | Admitting: Family

## 2018-10-31 ENCOUNTER — Other Ambulatory Visit: Payer: Self-pay

## 2018-10-31 VITALS — BP 110/78 | HR 79 | Temp 97.9°F | Resp 16 | Ht 73.0 in | Wt 229.2 lb

## 2018-10-31 DIAGNOSIS — F909 Attention-deficit hyperactivity disorder, unspecified type: Secondary | ICD-10-CM | POA: Diagnosis not present

## 2018-10-31 NOTE — Progress Notes (Signed)
Subjective:    Patient ID: Joel [name], male    DOB: 1986-01-18, 33 y.o.   MRN: [id number]  HPI   Patient is a 33 yr old male who presents today for follow up.  ADHD- reports that he is doing well on current dose of adderall. Feels that he is able to complete tasks at work and maintain his concentration.  Denies any side effects from medication.   Review of Systems See HPI  Past Medical History:  Diagnosis Date  . ADD (attention deficit disorder)   . Hyperlipemia   . Obstructive sleep apnea 09/10/2013   OSA per split night study 10/15, settings 12 cm h20 full face mask.     . Tobacco abuse      Social History   Socioeconomic History  . Marital status: Married    Spouse name: Not on file  . Number of children: Not on file  . Years of education: Not on file  . Highest education level: Not on file  Occupational History  . Not on file  Social Needs  . Financial resource strain: Not on file  . Food insecurity    Worry: Not on file    Inability: Not on file  . Transportation needs    Medical: Not on file    Non-medical: Not on file  Tobacco Use  . Smoking status: Current Every Day Smoker    Packs/day: 0.50  . Smokeless tobacco: Never Used  . Tobacco comment: <1/2 pack  per day  Substance and Sexual Activity  . Alcohol use: Yes    Comment: case a beer monthly   . Drug use: No    Frequency: 7.0 times per week  . Sexual activity: Yes  Lifestyle  . Physical activity    Days per week: Not on file    Minutes per session: Not on file  . Stress: Not on file  Relationships  . Social Herbalist on phone: Not on file    Gets together: Not on file    Attends religious service: Not on file    Active member of club or organization: Not on file    Attends meetings of clubs or organizations: Not on file    Relationship status: Not on file  . Intimate partner violence    Fear of current or ex partner: Not on file    Emotionally abused: Not on file   Physically abused: Not on file    Forced sexual activity: Not on file  Other Topics Concern  . Not on file  Social History Narrative   Works for [employer]   + tobacco abuse   2 children- daughter age 54 and daughter age 75   Married   Grew up in [place], moved here at age 35   Completed high school   Enjoys outdoor activities   Working on Restaurant manager, fast food       Past Surgical History:  Procedure Laterality Date  . KNEE SURGERY Right 12/29/13  . NO PAST SURGERIES     denies surgical history  . WISDOM TOOTH EXTRACTION  01/18/2016    Family History  Problem Relation Age of Onset  . Hypertension Mother   . Healthy Father   . Healthy Brother   . Sudden death Neg Hx   . Hyperlipidemia Neg Hx   . Heart attack Neg Hx   . Diabetes Neg Hx     Allergies  Allergen Reactions  . Bee Venom  Shortness Of Breath    Yellow Jacket  . Penicillins     Mother allergic to it. Pt never taken it.    Current Outpatient Medications on File Prior to Visit  Medication Sig Dispense Refill  . amphetamine-dextroamphetamine (ADDERALL) 20 MG tablet Take 1 tablet (20 mg total) by mouth 2 (two) times daily. 60 tablet 0  . EPINEPHrine 0.3 mg/0.3 mL IJ SOAJ injection Inject 0.3 mLs (0.3 mg total) into the muscle once. 2 Device 1   No current facility-administered medications on file prior to visit.     BP 110/78 (BP Location: Right Arm, Patient Position: Sitting, Cuff Size: Large)   Pulse 79   Temp 97.9 F (36.6 C) (Temporal)   Resp 16   Ht 6\' 1"  (1.854 m)   Wt 229 lb 3.2 oz (104 kg)   SpO2 98%   BMI 30.24 kg/m       Objective:   Physical Exam Constitutional:      General: He is not in acute distress.    Appearance: He is well-developed.  HENT:     Head: Normocephalic and atraumatic.  Cardiovascular:     Rate and Rhythm: Normal rate and regular rhythm.     Heart sounds: No murmur.  Pulmonary:     Effort: Pulmonary effort is normal. No respiratory  distress.     Breath sounds: Normal breath sounds. No wheezing or rales.  Skin:    General: Skin is warm and dry.  Neurological:     Mental Status: He is alert and oriented to person, place, and time.  Psychiatric:        Behavior: Behavior normal.        Thought Content: Thought content normal.           Assessment & Plan:  ADHD- stable on current dose of adderall. Continue same. Obtain UDS today. Contract updated. Reviewed [place] controlled substance registry.   Declines flu shot today. May get it from his employer.

## 2018-11-02 LAB — PAIN MGMT, PROFILE 8 W/CONF, U
6 Acetylmorphine: NEGATIVE ng/mL
Alcohol Metabolites: NEGATIVE ng/mL (ref <500)
Amphetamine: >40000 ng/mL
Amphetamines: POSITIVE ng/mL
Benzodiazepines: NEGATIVE ng/mL
Buprenorphine, Urine: NEGATIVE ng/mL
Cocaine Metabolite: NEGATIVE ng/mL
Creatinine: 183.6 mg/dL
MDMA: NEGATIVE ng/mL
Marijuana Metabolite: NEGATIVE ng/mL
Methamphetamine: NEGATIVE ng/mL
Opiates: NEGATIVE ng/mL
Oxidant: NEGATIVE ug/mL
Oxycodone: NEGATIVE ng/mL
pH: 5.5 (ref 4.5–9.0)

## 2018-11-03 ENCOUNTER — Other Ambulatory Visit: Payer: Self-pay

## 2018-11-03 ENCOUNTER — Ambulatory Visit (INDEPENDENT_AMBULATORY_CARE_PROVIDER_SITE_OTHER): Payer: BC Managed Care – PPO | Admitting: Family

## 2018-11-03 ENCOUNTER — Ambulatory Visit (HOSPITAL_BASED_OUTPATIENT_CLINIC_OR_DEPARTMENT_OTHER)
Admission: RE | Admit: 2018-11-03 | Discharge: 2018-11-03 | Disposition: A | Discharge status: Home / Self Care | Payer: BC Managed Care – PPO | Source: Ambulatory Visit | Attending: Family | Admitting: Family

## 2018-11-03 ENCOUNTER — Ambulatory Visit: Payer: Self-pay | Admitting: *Deleted

## 2018-11-03 DIAGNOSIS — M25571 Pain in right ankle and joints of right foot: Secondary | ICD-10-CM | POA: Diagnosis not present

## 2018-11-03 NOTE — Telephone Encounter (Signed)
Appt scheduled

## 2018-11-03 NOTE — Progress Notes (Signed)
Virtual Visit via Video Note  I connected with Joel [name] on 11/03/18 at  9:20 AM EDT by a video enabled telemedicine application and verified that I am speaking with the correct person using two identifiers.  Location: Patient: home Provider: home   I discussed the limitations of evaluation and management by telemedicine and the availability of in person appointments. The patient expressed understanding and agreed to proceed.  History of Present Illness:  Patient is a 33 yr old male who presents today with chief complaint of right sided ankle pain. He reports that he twisted his right ankle last night while running at the park with his kids. He reports that he felt as though his ankle "dislocated."  He is now having pain and swelling. Reports pain is worst "in the back near the achilles." Hurts to walk on his right foot.  Past Medical History:  Diagnosis Date  . ADD (attention deficit disorder)   . Hyperlipemia   . Obstructive sleep apnea 09/10/2013   OSA per split night study 10/15, settings 12 cm h20 full face mask.     . Tobacco abuse      Social History   Socioeconomic History  . Marital status: Married    Spouse name: Not on file  . Number of children: Not on file  . Years of education: Not on file  . Highest education level: Not on file  Occupational History  . Not on file  Social Needs  . Financial resource strain: Not on file  . Food insecurity    Worry: Not on file    Inability: Not on file  . Transportation needs    Medical: Not on file    Non-medical: Not on file  Tobacco Use  . Smoking status: Current Every Day Smoker    Packs/day: 0.50  . Smokeless tobacco: Never Used  . Tobacco comment: <1/2 pack  per day  Substance and Sexual Activity  . Alcohol use: Yes    Comment: case a beer monthly   . Drug use: No    Frequency: 7.0 times per week  . Sexual activity: Yes  Lifestyle  . Physical activity    Days per week: Not on file    Minutes per  session: Not on file  . Stress: Not on file  Relationships  . Social Herbalist on phone: Not on file    Gets together: Not on file    Attends religious service: Not on file    Active member of club or organization: Not on file    Attends meetings of clubs or organizations: Not on file    Relationship status: Not on file  . Intimate partner violence    Fear of current or ex partner: Not on file    Emotionally abused: Not on file    Physically abused: Not on file    Forced sexual activity: Not on file  Other Topics Concern  . Not on file  Social History Narrative   Works for [employer]   + tobacco abuse   2 children- daughter age 32 and daughter age 33   Married   Grew up in [place], moved here at age 21   Completed high school   Enjoys outdoor activities   Working on Restaurant manager, fast food       Past Surgical History:  Procedure Laterality Date  . KNEE SURGERY Right 12/29/13  . NO PAST SURGERIES     denies surgical history  .  WISDOM TOOTH EXTRACTION  01/18/2016    Family History  Problem Relation Age of Onset  . Hypertension Mother   . Healthy Father   . Healthy Brother   . Sudden death Neg Hx   . Hyperlipidemia Neg Hx   . Heart attack Neg Hx   . Diabetes Neg Hx     Allergies  Allergen Reactions  . Bee Venom Shortness Of Breath    Yellow Jacket  . Penicillins     Mother allergic to it. Pt never taken it.    Current Outpatient Medications on File Prior to Visit  Medication Sig Dispense Refill  . amphetamine-dextroamphetamine (ADDERALL) 20 MG tablet Take 1 tablet (20 mg total) by mouth 2 (two) times daily. 60 tablet 0  . EPINEPHrine 0.3 mg/0.3 mL IJ SOAJ injection Inject 0.3 mLs (0.3 mg total) into the muscle once. 2 Device 1   No current facility-administered medications on file prior to visit.     There were no vitals taken for this visit.   Observations/Objective:   Gen: Awake, alert, no acute distress Resp:  Breathing is even and non-labored Psych: calm/pleasant demeanor Neuro: Alert and Oriented x 3, + facial symmetry, speech is clear. MS:  R ankle swelling is noted  Assessment and Plan:  Right ankle pain- will obtain x-ray for further evaluation.  Pt is advised to ice 2-3 times daily, limit weight bearing, elevated.  Advised ibuprofen prn pain. Will write him out of work for the next 3 days. Further recommendations pending review of x-ray.    Follow Up Instructions:    I discussed the assessment and treatment plan with the patient. The patient was provided an opportunity to ask questions and all were answered. The patient agreed with the plan and demonstrated an understanding of the instructions.   The patient was advised to call back or seek an in-person evaluation if the symptoms worsen or if the condition fails to improve as anticipated.  Nance Pear, NP

## 2018-11-03 NOTE — Telephone Encounter (Signed)
Patient injured his ankle yesterday- playing with children. Patient would like appointment to be seen- call to office- could not get through to scheduling- note sent- patient to expect call back.  Reason for Disposition . [1] Limp when walking AND [2] due to a twisted ankle or foot  Answer Assessment - Initial Assessment Questions 1. MECHANISM: "How did the injury happen?" (e.g., twisting injury, direct blow)      Playing with children on playground- twisted ankle 2. ONSET: "When did the injury happen?" (Minutes or hours ago)      Yesterday-evening 3. LOCATION: "Where is the injury located?"      R ankle and tendon  4. APPEARANCE of INJURY: "What does the injury look like?"      Swelling present 5. WEIGHT-BEARING: "Can you put weight on that foot?" "Can you walk (four steps or more)?"       Yes- barely 6. SIZE: For cuts, bruises, or swelling, ask: "How large is it?" (e.g., inches or centimeters;  entire joint)      Mostly in the back and ankle of foot 7. PAIN: "Is there pain?" If so, ask: "How bad is the pain?"    (e.g., Scale 1-10; or mild, moderate, severe)     Yes- if leg elevated- no pain- if walking-8 8. TETANUS: For any breaks in the skin, ask: "When was the last tetanus booster?"     n/a 9. OTHER SYMPTOMS: "Do you have any other symptoms?"      no 10. PREGNANCY: "Is there any chance you are pregnant?" "When was your last menstrual period?"       n/a  Protocols used: ANKLE AND FOOT INJURY-A-AH

## 2018-11-03 NOTE — Telephone Encounter (Signed)
Patient has been scheduled for virtual visit at 9:20 with [facility]

## 2018-11-04 ENCOUNTER — Telehealth: Payer: Self-pay | Admitting: Family

## 2018-11-04 ENCOUNTER — Ambulatory Visit: Payer: BC Managed Care – PPO | Admitting: Family Medicine

## 2018-11-04 ENCOUNTER — Encounter: Payer: Self-pay | Admitting: Family Medicine

## 2018-11-04 ENCOUNTER — Ambulatory Visit: Payer: Self-pay

## 2018-11-04 VITALS — BP 119/81 | HR 66 | Ht 72.0 in | Wt 230.0 lb

## 2018-11-04 DIAGNOSIS — S86011A Strain of right Achilles tendon, initial encounter: Secondary | ICD-10-CM | POA: Diagnosis not present

## 2018-11-04 DIAGNOSIS — M7660 Achilles tendinitis, unspecified leg: Secondary | ICD-10-CM

## 2018-11-04 DIAGNOSIS — M25571 Pain in right ankle and joints of right foot: Secondary | ICD-10-CM

## 2018-11-04 NOTE — Telephone Encounter (Signed)
Pt states he cannot walk on leg at all and would like to see a specialist.  Please call pt to advise:   [phone]

## 2018-11-04 NOTE — Telephone Encounter (Signed)
Left detailed message on voicemail that referral has been placed and work note was updated in his mychart account.

## 2018-11-04 NOTE — Telephone Encounter (Signed)
Please contact pt and let him know that his x-ray is negative for fracture. Good news.  Let me know if he feels like he will be unable to walk on his ankle beginning Thursday. If so, I will write him out of work for the rest of the week with a plan to return Monday.

## 2018-11-04 NOTE — Progress Notes (Signed)
Joel [name] - 33 y.o. male MRN [id number]  Date of birth: 06/05/1985  SUBJECTIVE:  Including CC & ROS.  Chief Complaint  Patient presents with  . Foot Injury    right achilles x 11/02/2018    Joel [name] is a 33 y.o. male that is presenting with right Achilles pain.  His symptoms started couple of days ago.  He was outside playing with his kids when he felt a pop in the back of his lower leg.  After that he had significant pain, bruising and swelling.  He has not been able to walk normally since that time.  Most of the pain is localized at the lower leg and where the Achilles inserts into the calcaneus.  He has been icing and putting his leg up.  The pain ranges from mild to severe.  More pain occurs when he is trying to ambulate.  He has a history of Achilles rupture that they healed conservatively.   Review of Systems  Constitutional: Negative for fever.  HENT: Negative for congestion.   Respiratory: Negative for cough.   Cardiovascular: Negative for chest pain.  Gastrointestinal: Negative for abdominal pain.  Musculoskeletal: Positive for gait problem.  Skin: Positive for color change.  Neurological: Negative for numbness.  Hematological: Negative for adenopathy.    HISTORY: Past Medical, Surgical, Social, and Family History Reviewed & Updated per EMR.   Pertinent Historical Findings include:  Past Medical History:  Diagnosis Date  . ADD (attention deficit disorder)   . Hyperlipemia   . Obstructive sleep apnea 09/10/2013   OSA per split night study 10/15, settings 12 cm h20 full face mask.     . Tobacco abuse     Past Surgical History:  Procedure Laterality Date  . KNEE SURGERY Right 12/29/13  . NO PAST SURGERIES     denies surgical history  . WISDOM TOOTH EXTRACTION  01/18/2016    Allergies  Allergen Reactions  . Bee Venom Shortness Of Breath    Yellow Jacket  . Penicillins     Mother allergic to it. Pt never taken it.    Family History  Problem  Relation Age of Onset  . Hypertension Mother   . Healthy Father   . Healthy Brother   . Sudden death Neg Hx   . Hyperlipidemia Neg Hx   . Heart attack Neg Hx   . Diabetes Neg Hx      Social History   Socioeconomic History  . Marital status: Married    Spouse name: Not on file  . Number of children: Not on file  . Years of education: Not on file  . Highest education level: Not on file  Occupational History  . Not on file  Social Needs  . Financial resource strain: Not on file  . Food insecurity    Worry: Not on file    Inability: Not on file  . Transportation needs    Medical: Not on file    Non-medical: Not on file  Tobacco Use  . Smoking status: Current Every Day Smoker    Packs/day: 0.50  . Smokeless tobacco: Never Used  . Tobacco comment: <1/2 pack  per day  Substance and Sexual Activity  . Alcohol use: Yes    Comment: case a beer monthly   . Drug use: No    Frequency: 7.0 times per week  . Sexual activity: Yes  Lifestyle  . Physical activity    Days per week: Not on file    Minutes  per session: Not on file  . Stress: Not on file  Relationships  . Social Herbalist on phone: Not on file    Gets together: Not on file    Attends religious service: Not on file    Active member of club or organization: Not on file    Attends meetings of clubs or organizations: Not on file    Relationship status: Not on file  . Intimate partner violence    Fear of current or ex partner: Not on file    Emotionally abused: Not on file    Physically abused: Not on file    Forced sexual activity: Not on file  Other Topics Concern  . Not on file  Social History Narrative   Works for [employer]   + tobacco abuse   2 children- daughter age 29 and daughter age 74   Married   Grew up in [place], moved here at age 36   Completed high school   Enjoys outdoor activities   Working on Restaurant manager, fast food        PHYSICAL EXAM:  VS: BP  119/81   Pulse 66   Ht 6' (1.829 m)   Wt 230 lb (104.3 kg)   BMI 31.19 kg/m  Physical Exam Gen: NAD, alert, cooperative with exam, well-appearing ENT: normal lips, normal nasal mucosa,  Eye: normal EOM, normal conjunctiva and lids CV:  no edema, +2 pedal pulses   Resp: no accessory muscle use, non-labored,  Skin: no rashes, no areas of induration  Neuro: normal tone, normal sensation to touch Psych:  normal insight, alert and oriented MSK:  Right ankle/foot: Significant swelling and ecchymosis on the posterior aspect of the ankle and heel. Tenderness to palpation at the insertion of the Achilles in the mid belly. Plantarflexion and dorsiflexion intact and has good strength resistance. No plantarflexion with Thompson test. Neurovascular intact  Limited ultrasound: Right Achilles:  Normal appearance of the Achilles at the insertion. Complete rupture roughly 4.5 cm proximally to the insertion.  Summary: Complete rupture of the Achilles tendon  Ultrasound and interpretation by Clearance Coots, MD      ASSESSMENT & PLAN:   Achilles rupture, right Rupture occurred on 10/4. Occurring roughly 4.5 cm proximal from the insertion.  - CAM walker with heel lift - referral to ortho. Dr. Lucia Gaskins.

## 2018-11-04 NOTE — Assessment & Plan Note (Signed)
Rupture occurred on 10/4. Occurring roughly 4.5 cm proximal from the insertion.  - CAM walker with heel lift - referral to ortho. Dr. Lucia Gaskins.

## 2018-11-04 NOTE — Telephone Encounter (Signed)
Patient received message and he already received a called from specialist and was given appointment.

## 2018-11-04 NOTE — Patient Instructions (Signed)
Nice to meet you Please use CAM walker   You will get a call about the ortho appointment.  Please send me a message in MyChart with any questions or updates.  Please see [place] back needed.   --Dr. Raeford Razor

## 2018-11-10 ENCOUNTER — Other Ambulatory Visit: Payer: Self-pay | Admitting: Family

## 2018-11-10 NOTE — Telephone Encounter (Signed)
Medication Refill - Medication: amphetamine-dextroamphetamine (ADDERALL) 20 MG tablet   Has the patient contacted their pharmacy? No. (Agent: If no, request that the patient contact the pharmacy for the refill.) (Agent: If yes, when and what did the pharmacy advise?)  Preferred Pharmacy (with phone number or street name):  [id number] - [place], [place] - [address] [place]  [address] [place] [address]  Phone: [phone] Fax: [phone]  Not a 24 hour pharmacy; exact hours not known.     Agent: Please be advised that RX refills may take up to 3 business days. We ask that you follow-up with your pharmacy.

## 2018-11-10 NOTE — Telephone Encounter (Signed)
Requested medication (s) are due for refill today: yes  Requested medication (s) are on the active medication list: yes  Last refill: refill cannot be delegated   Future visit scheduled: no  Notes to clinic: refill cannot be delegated    Requested Prescriptions  Pending Prescriptions Disp Refills   amphetamine-dextroamphetamine (ADDERALL) 20 MG tablet 60 tablet 0    Sig: Take 1 tablet (20 mg total) by mouth 2 (two) times daily.     Not Delegated - Psychiatry:  Stimulants/ADHD Failed - 11/10/2018 10:27 AM      Failed - This refill cannot be delegated      Passed - Urine Drug Screen completed in last 360 days.      Passed - Valid encounter within last 3 months    Recent Outpatient Visits          1 week ago Acute right ankle pain   [facility], NP   1 week ago Attention deficit hyperactivity disorder (ADHD), unspecified ADHD type   [facility], NP   3 months ago Attention deficit hyperactivity disorder (ADHD), unspecified ADHD type   [facility], NP   10 months ago Attention deficit hyperactivity disorder (ADHD), unspecified ADHD type   [facility], NP   1 year ago Attention deficit hyperactivity disorder (ADHD), unspecified ADHD type   [facility] [place], [place]

## 2018-11-11 MED ORDER — AMPHETAMINE-DEXTROAMPHETAMINE 20 MG PO TABS: 20.0000 mg | ORAL_TABLET | Freq: Two times a day (BID) | ORAL | 0 refills | Status: DC

## 2018-11-14 DIAGNOSIS — S86011A Strain of right Achilles tendon, initial encounter: Secondary | ICD-10-CM | POA: Diagnosis not present

## 2018-11-18 DIAGNOSIS — M25571 Pain in right ankle and joints of right foot: Secondary | ICD-10-CM | POA: Diagnosis not present

## 2018-11-18 DIAGNOSIS — S86011A Strain of right Achilles tendon, initial encounter: Secondary | ICD-10-CM | POA: Diagnosis not present

## 2018-11-25 DIAGNOSIS — M25571 Pain in right ankle and joints of right foot: Secondary | ICD-10-CM | POA: Diagnosis not present

## 2018-11-25 DIAGNOSIS — S86011A Strain of right Achilles tendon, initial encounter: Secondary | ICD-10-CM | POA: Diagnosis not present

## 2018-11-28 DIAGNOSIS — S86011A Strain of right Achilles tendon, initial encounter: Secondary | ICD-10-CM | POA: Diagnosis not present

## 2018-11-28 DIAGNOSIS — M25571 Pain in right ankle and joints of right foot: Secondary | ICD-10-CM | POA: Diagnosis not present

## 2018-12-02 DIAGNOSIS — M25571 Pain in right ankle and joints of right foot: Secondary | ICD-10-CM | POA: Diagnosis not present

## 2018-12-02 DIAGNOSIS — S86011A Strain of right Achilles tendon, initial encounter: Secondary | ICD-10-CM | POA: Diagnosis not present

## 2018-12-04 DIAGNOSIS — M25571 Pain in right ankle and joints of right foot: Secondary | ICD-10-CM | POA: Diagnosis not present

## 2018-12-04 DIAGNOSIS — S86011A Strain of right Achilles tendon, initial encounter: Secondary | ICD-10-CM | POA: Diagnosis not present

## 2018-12-08 DIAGNOSIS — M25571 Pain in right ankle and joints of right foot: Secondary | ICD-10-CM | POA: Diagnosis not present

## 2018-12-08 DIAGNOSIS — S86011A Strain of right Achilles tendon, initial encounter: Secondary | ICD-10-CM | POA: Diagnosis not present

## 2018-12-12 ENCOUNTER — Telehealth: Payer: Self-pay | Admitting: Family

## 2018-12-12 DIAGNOSIS — M25571 Pain in right ankle and joints of right foot: Secondary | ICD-10-CM | POA: Diagnosis not present

## 2018-12-12 DIAGNOSIS — S86011A Strain of right Achilles tendon, initial encounter: Secondary | ICD-10-CM | POA: Diagnosis not present

## 2018-12-12 NOTE — Telephone Encounter (Signed)
rx refill amphetamine-dextroamphetamine (ADDERALL) 20 MG tablet  [id number] - [place], [place] - [address] [place] [address] [phone] (Phone) [phone] (Fax)

## 2018-12-15 MED ORDER — AMPHETAMINE-DEXTROAMPHETAMINE 20 MG PO TABS: 20.0000 mg | ORAL_TABLET | Freq: Two times a day (BID) | ORAL | 0 refills | Status: DC

## 2019-01-09 ENCOUNTER — Other Ambulatory Visit: Payer: Self-pay | Admitting: Family

## 2019-01-09 MED ORDER — AMPHETAMINE-DEXTROAMPHETAMINE 20 MG PO TABS: 20.0000 mg | ORAL_TABLET | Freq: Two times a day (BID) | ORAL | 0 refills | Status: DC

## 2019-01-09 NOTE — Telephone Encounter (Signed)
Medication Refill - Medication: amphetamine-dextroamphetamine (ADDERALL) 20 MG tablet    Preferred Pharmacy (with phone number or street name):  [id number] - [place], [place] - [address] AT [place] Phone:  [phone]  Fax:  [phone]       Agent: Please be advised that RX refills may take up to 3 business days. We ask that you follow-up with your pharmacy.

## 2019-02-11 ENCOUNTER — Telehealth: Payer: Self-pay | Admitting: Family

## 2019-02-11 NOTE — Telephone Encounter (Signed)
rx refill amphetamine-dextroamphetamine (ADDERALL) 20 MG tablet [id number] - [place], [place] - [address] AT [place] Phone:  [phone]  Fax:  [phone]

## 2019-02-12 MED ORDER — AMPHETAMINE-DEXTROAMPHETAMINE 20 MG PO TABS: 20.0000 mg | ORAL_TABLET | Freq: Two times a day (BID) | ORAL | 0 refills | Status: DC

## 2019-02-12 NOTE — Telephone Encounter (Signed)
See refill

## 2019-03-13 ENCOUNTER — Telehealth: Payer: Self-pay

## 2019-03-13 NOTE — Telephone Encounter (Signed)
Patient called in to see if Dr. Conley Canal can send over a prescription for    amphetamine-dextroamphetamine (ADDERALL) 20 MG tablet [id number]

## 2019-03-16 MED ORDER — AMPHETAMINE-DEXTROAMPHETAMINE 20 MG PO TABS: 20.0000 mg | ORAL_TABLET | Freq: Two times a day (BID) | ORAL | 0 refills | Status: DC

## 2019-04-10 ENCOUNTER — Telehealth: Payer: Self-pay | Admitting: Family

## 2019-04-10 NOTE — Telephone Encounter (Signed)
Medication: amphetamine-dextroamphetamine (ADDERALL) 20 MG tablet [id number]    Has the patient contacted their pharmacy? No. (If no, request that the patient contact the pharmacy for the refill.) (If yes, when and what did the pharmacy advise?)  Preferred Pharmacy (with phone number or street name):  [id number] - [place], [place] - [address]  [address] [place], [place] [address]  Phone:  [phone] Fax:  [phone]  DEA #:  [id number] Agent: Please be advised that RX refills may take up to 3 business days. We ask that you follow-up with your pharmacy.

## 2019-04-13 MED ORDER — AMPHETAMINE-DEXTROAMPHETAMINE 20 MG PO TABS: 20.0000 mg | ORAL_TABLET | Freq: Two times a day (BID) | ORAL | 0 refills | Status: DC

## 2019-05-14 ENCOUNTER — Telehealth: Payer: Self-pay

## 2019-05-14 MED ORDER — AMPHETAMINE-DEXTROAMPHETAMINE 20 MG PO TABS: 20.0000 mg | ORAL_TABLET | Freq: Two times a day (BID) | ORAL | 0 refills | Status: DC

## 2019-05-14 NOTE — Telephone Encounter (Signed)
Requesting:adderall Contract:10.29.20 UDS:10.02.2020 Last Visit:10.05.20 Next Visit:N/A Last Refill: 04/13/19  Please Advise

## 2019-05-14 NOTE — Telephone Encounter (Signed)
Patient called in to see if NP Inda Castle  Could call in a prescription for  amphetamine-dextroamphetamine (ADDERALL) 20 MG tablet [id number]    Please send it to [id number] - [place], [place] - [address] AT [place]  [address] [place], [place] [address]  Phone:  [phone] Fax:  [phone]  DEA #:  [id number]

## 2019-06-25 ENCOUNTER — Telehealth: Payer: Self-pay | Admitting: Family

## 2019-06-25 NOTE — Telephone Encounter (Signed)
Medication: amphetamine-dextroamphetamine (ADDERALL) 20 MG tablet       Has the patient contacted their pharmacy? yes (If no, request that the patient contact the pharmacy for the refill.) (If yes, when and what did the pharmacy advise?) contact your doctor     Preferred Pharmacy (with phone number or street name): [id number] - [place], [place] - [address]  [address] [place], [place] [address]  Phone:  [phone] Fax:  [phone]     Agent: Please be advised that RX refills may take up to 3 business days. We ask that you follow-up with your pharmacy.

## 2019-06-26 MED ORDER — AMPHETAMINE-DEXTROAMPHETAMINE 20 MG PO TABS: 20.0000 mg | ORAL_TABLET | Freq: Two times a day (BID) | ORAL | 0 refills | Status: DC

## 2019-06-26 NOTE — Addendum Note (Signed)
Addended by: [name] on: 06/26/2019 04:43 PM   Modules accepted: Orders

## 2019-06-26 NOTE — Telephone Encounter (Signed)
Last rx:05-14-2019 Contract:10.29.20 UDS:10.02.2020 Last Visit:10.05.20 Next Visit:N/A

## 2019-06-26 NOTE — Telephone Encounter (Signed)
Please contact pt to schedule a follow up visit.  

## 2019-06-30 NOTE — Telephone Encounter (Signed)
DONE

## 2019-07-03 ENCOUNTER — Ambulatory Visit: Payer: BC Managed Care – PPO | Admitting: Family

## 2019-07-08 ENCOUNTER — Ambulatory Visit: Payer: BC Managed Care – PPO | Admitting: Family

## 2019-07-13 ENCOUNTER — Ambulatory Visit: Payer: BC Managed Care – PPO | Admitting: Family

## 2019-07-17 ENCOUNTER — Ambulatory Visit (INDEPENDENT_AMBULATORY_CARE_PROVIDER_SITE_OTHER): Payer: BC Managed Care – PPO | Admitting: Family

## 2019-07-17 ENCOUNTER — Encounter: Payer: Self-pay | Admitting: Family

## 2019-07-17 ENCOUNTER — Other Ambulatory Visit: Payer: Self-pay

## 2019-07-17 VITALS — BP 113/82 | HR 72 | Temp 97.8°F | Resp 16 | Ht 73.0 in | Wt 228.0 lb

## 2019-07-17 DIAGNOSIS — S86011D Strain of right Achilles tendon, subsequent encounter: Secondary | ICD-10-CM | POA: Diagnosis not present

## 2019-07-17 DIAGNOSIS — F909 Attention-deficit hyperactivity disorder, unspecified type: Secondary | ICD-10-CM

## 2019-07-17 NOTE — Progress Notes (Signed)
Subjective:    Patient ID: Joel [name], male    DOB: 04/23/1985, 34 y.o.   MRN: [id number]  HPI  Patient is a 34 yr old male who presents today for follow up of ADHD. He continues adderall and reports good focus at work and throughout the day. Has no other complaints or concerns.  Rupture of achilles tendon- reports that he did physical therapy and opted to avoid surgery. He reports that he still has some associated RLE muscle atrophy but he continues to get stronger.  Had baby girl in the end of March.      Review of Systems See HPI  Past Medical History:  Diagnosis Date  . ADD (attention deficit disorder)   . Hyperlipemia   . Obstructive sleep apnea 09/10/2013   OSA per split night study 10/15, settings 12 cm h20 full face mask.     . Tobacco abuse      Social History   Socioeconomic History  . Marital status: Married    Spouse name: Not on file  . Number of children: Not on file  . Years of education: Not on file  . Highest education level: Not on file  Occupational History  . Not on file  Tobacco Use  . Smoking status: Current Every Day Smoker    Packs/day: 0.50  . Smokeless tobacco: Never Used  . Tobacco comment: <1/2 pack  per day  Substance and Sexual Activity  . Alcohol use: Yes    Comment: case a beer monthly   . Drug use: No    Frequency: 7.0 times per week  . Sexual activity: Yes  Other Topics Concern  . Not on file  Social History Narrative   Works for [employer]   + tobacco abuse   2 children- daughter age 42 and daughter age 67   Married   Grew up in [place], moved here at age 89   Completed high school   Enjoys outdoor activities   Working on Restaurant manager, fast food      Social Determinants of Radio broadcast assistant Strain:   . Difficulty of Paying Living Expenses:   Food Insecurity:   . Worried About Charity fundraiser in the Last Year:   . Arboriculturist in the Last Year:   Transportation  Needs:   . Film/video editor (Medical):   Marland Kitchen Lack of Transportation (Non-Medical):   Physical Activity:   . Days of Exercise per Week:   . Minutes of Exercise per Session:   Stress:   . Feeling of Stress :   Social Connections:   . Frequency of Communication with Friends and Family:   . Frequency of Social Gatherings with Friends and Family:   . Attends Religious Services:   . Active Member of Clubs or Organizations:   . Attends Archivist Meetings:   Marland Kitchen Marital Status:   Intimate Partner Violence:   . Fear of Current or Ex-Partner:   . Emotionally Abused:   Marland Kitchen Physically Abused:   . Sexually Abused:     Past Surgical History:  Procedure Laterality Date  . KNEE SURGERY Right 12/29/13  . NO PAST SURGERIES     denies surgical history  . WISDOM TOOTH EXTRACTION  01/18/2016    Family History  Problem Relation Age of Onset  . Hypertension Mother   . Healthy Father   . Healthy Brother   . Sudden death Neg Hx   . Hyperlipidemia  Neg Hx   . Heart attack Neg Hx   . Diabetes Neg Hx     Allergies  Allergen Reactions  . Bee Venom Shortness Of Breath    Yellow Jacket  . Penicillins     Mother allergic to it. Pt never taken it.    Current Outpatient Medications on File Prior to Visit  Medication Sig Dispense Refill  . amphetamine-dextroamphetamine (ADDERALL) 20 MG tablet Take 1 tablet (20 mg total) by mouth 2 (two) times daily. 60 tablet 0  . EPINEPHrine 0.3 mg/0.3 mL IJ SOAJ injection Inject 0.3 mLs (0.3 mg total) into the muscle once. 2 Device 1   No current facility-administered medications on file prior to visit.    BP 113/82 (BP Location: Right Arm, Patient Position: Sitting, Cuff Size: Large)   Pulse 72   Temp 97.8 F (36.6 C) (Temporal)   Resp 16   Ht 6\' 1"  (1.854 m)   Wt 228 lb (103.4 kg)   SpO2 99%   BMI 30.08 kg/m       Objective:   Physical Exam Constitutional:      General: He is not in acute distress.    Appearance: He is  well-developed.  HENT:     Head: Normocephalic and atraumatic.  Cardiovascular:     Rate and Rhythm: Normal rate and regular rhythm.     Heart sounds: No murmur heard.   Pulmonary:     Effort: Pulmonary effort is normal. No respiratory distress.     Breath sounds: Normal breath sounds. No wheezing or rales.  Skin:    General: Skin is warm and dry.  Neurological:     Mental Status: He is alert and oriented to person, place, and time.  Psychiatric:        Behavior: Behavior normal.        Thought Content: Thought content normal.           Assessment & Plan:  ADHD- stable.  Controlled substance contract is up to date.  Will obtain follow up UDS. Continue adderall.  Achilles tendon rupture- improving following PT. Monitor.  This visit occurred during the SARS-CoV-2 public health emergency.  Safety protocols were in place, including screening questions prior to the visit, additional usage of staff PPE, and extensive cleaning of exam room while observing appropriate contact time as indicated for disinfecting solutions.

## 2019-07-19 LAB — DRUG MONITORING, PANEL 8 WITH CONFIRMATION, URINE
6 Acetylmorphine: NEGATIVE ng/mL (ref <10)
Alcohol Metabolites: NEGATIVE ng/mL
Amphetamine: 1413 ng/mL — ABNORMAL HIGH (ref <250)
Amphetamines: POSITIVE ng/mL — AB (ref <500)
Benzodiazepines: NEGATIVE ng/mL (ref <100)
Buprenorphine, Urine: NEGATIVE ng/mL (ref <5)
Cocaine Metabolite: NEGATIVE ng/mL (ref <150)
Creatinine: 210.9 mg/dL
MDMA: NEGATIVE ng/mL (ref <500)
Marijuana Metabolite: NEGATIVE ng/mL (ref <20)
Methamphetamine: NEGATIVE ng/mL (ref <250)
Opiates: NEGATIVE ng/mL (ref <100)
Oxidant: NEGATIVE ug/mL
Oxycodone: NEGATIVE ng/mL (ref <100)
pH: 5.7 (ref 4.5–9.0)

## 2019-07-19 LAB — DM TEMPLATE

## 2019-07-27 ENCOUNTER — Telehealth: Payer: Self-pay

## 2019-07-27 NOTE — Telephone Encounter (Signed)
Patient called in to get a prescription refill for amphetamine-dextroamphetamine (ADDERALL) 20 MG tablet [[id number]]    Please send it to [id number] - [place], [place] - [address] AT [place]  [address], [place] [address]  Phone:  [phone] Fax:  [phone]  DEA #:  [id number]

## 2019-07-29 MED ORDER — AMPHETAMINE-DEXTROAMPHETAMINE 20 MG PO TABS: 20.0000 mg | ORAL_TABLET | Freq: Two times a day (BID) | ORAL | 0 refills | Status: DC

## 2019-07-29 NOTE — Addendum Note (Signed)
Addended by: [name] on: 07/29/2019 07:12 AM   Modules accepted: Orders

## 2019-08-14 ENCOUNTER — Ambulatory Visit (INDEPENDENT_AMBULATORY_CARE_PROVIDER_SITE_OTHER): Payer: BC Managed Care – PPO | Admitting: Family

## 2019-08-14 DIAGNOSIS — Z5329 Procedure and treatment not carried out because of patient's decision for other reasons: Secondary | ICD-10-CM

## 2019-08-17 NOTE — Progress Notes (Signed)
Erroneous encounter- pt no showed 

## 2019-08-26 ENCOUNTER — Telehealth: Payer: Self-pay

## 2019-08-26 MED ORDER — AMPHETAMINE-DEXTROAMPHETAMINE 20 MG PO TABS: 20.0000 mg | ORAL_TABLET | Freq: Two times a day (BID) | ORAL | 0 refills | Status: DC

## 2019-08-26 NOTE — Telephone Encounter (Signed)
Last RX:07-29-19 #60 Last OV:07-17-2019 Next VG:CYOY scheduled UDS:07-17-2019 CSC: 11-27-2018 CSR: no discrepancies

## 2019-08-26 NOTE — Addendum Note (Signed)
Addended by: [name] on: 08/26/2019 04:33 PM   Modules accepted: Orders

## 2019-08-26 NOTE — Telephone Encounter (Signed)
Pt called requesting refill on Adderall 20 mg tab.  Pharmacy is [facility] [address], [place].  Also reminded pt he missed his CPE this month.  Pt stated he was out of town at the time and will CB to R/S.

## 2019-09-28 ENCOUNTER — Telehealth: Payer: Self-pay | Admitting: Family

## 2019-09-28 NOTE — Telephone Encounter (Signed)
Last RX:08-26-19 #60 Last OV:07-17-2019 Next XM:DYJW scheduled UDS:07-17-2019 CSC: 11-27-2018 CSR: no discrepancies

## 2019-09-28 NOTE — Telephone Encounter (Signed)
Medication: amphetamine-dextroamphetamine (ADDERALL) 20 MG tablet [[id number]]    Has the patient contacted their pharmacy? No. (If no, request that the patient contact the pharmacy for the refill.) (If yes, when and what did the pharmacy advise?)  Preferred Pharmacy (with phone number or street name): [facility] #[id number] - [place], [place] - [address]  [address] [place], [place] [address]  Phone:  [phone] Fax:  [phone]  DEA #:  [id number]  Agent: Please be advised that RX refills may take up to 3 business days. We ask that you follow-up with your pharmacy.

## 2019-09-29 MED ORDER — AMPHETAMINE-DEXTROAMPHETAMINE 20 MG PO TABS: 20.0000 mg | ORAL_TABLET | Freq: Two times a day (BID) | ORAL | 0 refills | Status: DC

## 2019-10-27 ENCOUNTER — Telehealth: Payer: Self-pay | Admitting: Family

## 2019-10-27 NOTE — Telephone Encounter (Signed)
Medication:  amphetamine-dextroamphetamine (ADDERALL) 20 MG tablet [[id number]]   Has the patient contacted their pharmacy? No. (If no, request that the patient contact the pharmacy for the refill.) (If yes, when and what did the pharmacy advise?)  Preferred Pharmacy (with phone number or street name): [facility] #[id number] - [place], [place] - [address]  [address] [place], [place] [address]  Phone:  [phone] Fax:  [phone]  DEA #:  [id number]  Agent: Please be advised that RX refills may take up to 3 business days. We ask that you follow-up with your pharmacy.

## 2019-10-27 NOTE — Telephone Encounter (Signed)
Pt no showed visit in July 2021- will need to be seen for refills please.

## 2019-10-28 DIAGNOSIS — Z20828 Contact with and (suspected) exposure to other viral communicable diseases: Secondary | ICD-10-CM | POA: Diagnosis not present

## 2019-10-30 ENCOUNTER — Telehealth: Payer: Self-pay | Admitting: Family

## 2019-10-30 MED ORDER — AMPHETAMINE-DEXTROAMPHETAMINE 20 MG PO TABS: 20.0000 mg | ORAL_TABLET | Freq: Two times a day (BID) | ORAL | 0 refills | Status: DC

## 2019-10-30 NOTE — Telephone Encounter (Signed)
Patient states he had get tested because his daughter was positive. Patient states he has no symptoms was tested for covid on 10/28/19, got negative result on 10/29/19. Patient states he still has no symptoms. His test done at [facility], however, in order to be able to return back to work patient need a doctors note.   Patient also need a refill on amphetamine-dextroamphetamine (ADDERALL) 20 MG tablet [[id number]]   [facility] #[id number] - [place], [place] - [address] AT [facility] [address]  [address], [place] [address]  Phone:  [phone] Fax:  [phone]

## 2019-10-30 NOTE — Telephone Encounter (Signed)
Wednesday 9/29 his covid test was negative  He is unvaccinated  Daughter got sick on 9/26 and has tested + for covid.  Advised pt that we may be able to send him back after 7 days if he remains asymptomatic and if he tests negative after day 5 of exposure. He will get tested one more time this weekend and contact us with his results on Monday.

## 2019-10-30 NOTE — Telephone Encounter (Signed)
Patient called back in reference to message sent earlier to check status.

## 2019-10-31 DIAGNOSIS — Z20828 Contact with and (suspected) exposure to other viral communicable diseases: Secondary | ICD-10-CM | POA: Diagnosis not present

## 2019-11-02 ENCOUNTER — Encounter: Payer: Self-pay | Admitting: Family

## 2019-11-02 NOTE — Telephone Encounter (Signed)
Patient states took covid test over the weekend and it was negative, patient is requesting a note to return to work today.

## 2019-11-11 DIAGNOSIS — M9902 Segmental and somatic dysfunction of thoracic region: Secondary | ICD-10-CM | POA: Diagnosis not present

## 2019-11-11 DIAGNOSIS — M9901 Segmental and somatic dysfunction of cervical region: Secondary | ICD-10-CM | POA: Diagnosis not present

## 2019-11-11 DIAGNOSIS — M9908 Segmental and somatic dysfunction of rib cage: Secondary | ICD-10-CM | POA: Diagnosis not present

## 2019-11-11 DIAGNOSIS — M531 Cervicobrachial syndrome: Secondary | ICD-10-CM | POA: Diagnosis not present

## 2019-11-12 DIAGNOSIS — M531 Cervicobrachial syndrome: Secondary | ICD-10-CM | POA: Diagnosis not present

## 2019-11-12 DIAGNOSIS — M9901 Segmental and somatic dysfunction of cervical region: Secondary | ICD-10-CM | POA: Diagnosis not present

## 2019-11-12 DIAGNOSIS — M9902 Segmental and somatic dysfunction of thoracic region: Secondary | ICD-10-CM | POA: Diagnosis not present

## 2019-11-12 DIAGNOSIS — M9908 Segmental and somatic dysfunction of rib cage: Secondary | ICD-10-CM | POA: Diagnosis not present

## 2019-11-16 DIAGNOSIS — M9902 Segmental and somatic dysfunction of thoracic region: Secondary | ICD-10-CM | POA: Diagnosis not present

## 2019-11-16 DIAGNOSIS — M9901 Segmental and somatic dysfunction of cervical region: Secondary | ICD-10-CM | POA: Diagnosis not present

## 2019-11-16 DIAGNOSIS — M531 Cervicobrachial syndrome: Secondary | ICD-10-CM | POA: Diagnosis not present

## 2019-11-16 DIAGNOSIS — M9908 Segmental and somatic dysfunction of rib cage: Secondary | ICD-10-CM | POA: Diagnosis not present

## 2019-11-26 ENCOUNTER — Telehealth: Payer: Self-pay | Admitting: Family

## 2019-11-26 NOTE — Telephone Encounter (Signed)
Medication: amphetamine-dextroamphetamine (ADDERALL) 20 MG tablet [[id number]]       Has the patient contacted their pharmacy?  (If no, request that the patient contact the pharmacy for the refill.) (If yes, when and what did the pharmacy advise?)     Preferred Pharmacy (with phone number or street name):  [facility] #[id number] - [place], [place] - [address]  [address] [place], [place] [address]  Phone:  [phone] Fax:  [phone]     Agent: Please be advised that RX refills may take up to 3 business days. We ask that you follow-up with your pharmacy.

## 2019-11-26 NOTE — Telephone Encounter (Signed)
Last written: 10/30/19 Last ov:  07/17/19 Next ov: 12/07/19  Contract: 10/31/18 UDS: 07/17/19

## 2019-11-27 MED ORDER — AMPHETAMINE-DEXTROAMPHETAMINE 20 MG PO TABS: 20.0000 mg | ORAL_TABLET | Freq: Two times a day (BID) | ORAL | 0 refills | Status: DC

## 2019-11-27 NOTE — Addendum Note (Signed)
Addended by: [name] on: 11/27/2019 07:43 AM   Modules accepted: Orders

## 2019-12-07 ENCOUNTER — Encounter: Payer: BC Managed Care – PPO | Admitting: Family

## 2019-12-28 ENCOUNTER — Telehealth: Payer: Self-pay | Admitting: Family

## 2019-12-28 MED ORDER — AMPHETAMINE-DEXTROAMPHETAMINE 20 MG PO TABS: 20.0000 mg | ORAL_TABLET | Freq: Two times a day (BID) | ORAL | 0 refills | Status: DC

## 2019-12-28 NOTE — Telephone Encounter (Signed)
Medication: amphetamine-dextroamphetamine (ADDERALL) 20 MG tablet [[id number]]    Has the patient contacted their pharmacy? No. (If no, request that the patient contact the pharmacy for the refill.) (If yes, when and what did the pharmacy advise?)  Preferred Pharmacy (with phone number or street name): [facility] #[id number] - [place], [place] - [address]  [address] [place], [place] [address]  Phone:  [phone] Fax:  [phone]  DEA #:  [id number]  Agent: Please be advised that RX refills may take up to 3 business days. We ask that you follow-up with your pharmacy.

## 2019-12-28 NOTE — Telephone Encounter (Signed)
Last RX:  11-27-19 Last OV:   07-17-19 Next OV:   01-13-20 UDS: 07-19-19 CSC:11-27-19 CSR:  No discrepancies

## 2019-12-29 ENCOUNTER — Encounter: Payer: BC Managed Care – PPO | Admitting: Family

## 2020-01-13 ENCOUNTER — Ambulatory Visit (INDEPENDENT_AMBULATORY_CARE_PROVIDER_SITE_OTHER): Payer: BC Managed Care – PPO | Admitting: Family

## 2020-01-13 ENCOUNTER — Encounter: Payer: Self-pay | Admitting: Family

## 2020-01-13 ENCOUNTER — Other Ambulatory Visit: Payer: Self-pay

## 2020-01-13 VITALS — BP 128/85 | HR 70 | Temp 97.9°F | Resp 18 | Ht 73.0 in | Wt 224.0 lb

## 2020-01-13 DIAGNOSIS — E785 Hyperlipidemia, unspecified: Secondary | ICD-10-CM

## 2020-01-13 DIAGNOSIS — Z0000 Encounter for general adult medical examination without abnormal findings: Secondary | ICD-10-CM

## 2020-01-13 DIAGNOSIS — G4733 Obstructive sleep apnea (adult) (pediatric): Secondary | ICD-10-CM | POA: Diagnosis not present

## 2020-01-13 DIAGNOSIS — Z720 Tobacco use: Secondary | ICD-10-CM | POA: Diagnosis not present

## 2020-01-13 DIAGNOSIS — F909 Attention-deficit hyperactivity disorder, unspecified type: Secondary | ICD-10-CM

## 2020-01-13 LAB — COMPREHENSIVE METABOLIC PANEL
ALT: 52 U/L (ref 0–53)
AST: 22 U/L (ref 0–37)
Albumin: 4.3 g/dL (ref 3.5–5.2)
Alkaline Phosphatase: 79 U/L (ref 39–117)
BUN: 11 mg/dL (ref 6–23)
CO2: 29 mEq/L (ref 19–32)
Calcium: 9.4 mg/dL (ref 8.4–10.5)
Chloride: 104 mEq/L (ref 96–112)
Creatinine, Ser: 0.92 mg/dL (ref 0.40–1.50)
GFR: 106.77 mL/min (ref >60.00)
Glucose, Bld: 92 mg/dL (ref 70–99)
Potassium: 4.5 mEq/L (ref 3.5–5.1)
Sodium: 139 mEq/L (ref 135–145)
Total Bilirubin: 0.5 mg/dL (ref 0.2–1.2)
Total Protein: 6.8 g/dL (ref 6.0–8.3)

## 2020-01-13 LAB — LIPID PANEL
Cholesterol: 189 mg/dL (ref 0–200)
HDL: 39.8 mg/dL (ref >39.00)
LDL Cholesterol: 131 mg/dL — ABNORMAL HIGH (ref 0–99)
NonHDL: 149.47
Total CHOL/HDL Ratio: 5
Triglycerides: 94 mg/dL (ref 0.0–149.0)
VLDL: 18.8 mg/dL (ref 0.0–40.0)

## 2020-01-13 NOTE — Progress Notes (Signed)
Subjective:    Patient ID: Joel [name], male    DOB: [birth year], 34 y.o.   MRN: [id number]  HPI   Patient presents today for complete physical.  Immunizations: tdap 2012, declines flu shot, declines covid vaccine Diet: healthy Exercise: running, weights Vision: 3 months ago Dental: scheduled  5 cigarettes a week    Review of Systems  Constitutional: Negative for unexpected weight change.  HENT: Negative for hearing loss and rhinorrhea.   Eyes: Negative for visual disturbance.  Respiratory: Negative for cough and shortness of breath.   Cardiovascular: Negative for chest pain.  Gastrointestinal: Negative for constipation and diarrhea.  Genitourinary: Negative for dysuria, frequency and hematuria.  Musculoskeletal: Negative for arthralgias and myalgias.  Skin: Negative for rash.  Neurological: Negative for headaches.  Hematological: Negative for adenopathy.  Psychiatric/Behavioral:       Denies depression/anxiety       Past Medical History:  Diagnosis Date  . ADD (attention deficit disorder)   . Hyperlipemia   . Obstructive sleep apnea 09/10/2013   OSA per split night study 10/15, settings 12 cm h20 full face mask.     . Tobacco abuse      Social History   Socioeconomic History  . Marital status: Married    Spouse name: Not on file  . Number of children: Not on file  . Years of education: Not on file  . Highest education level: Not on file  Occupational History  . Not on file  Tobacco Use  . Smoking status: Current Every Day Smoker    Packs/day: 0.50  . Smokeless tobacco: Never Used  . Tobacco comment: <1/2 pack  per day  Substance and Sexual Activity  . Alcohol use: Yes    Comment: case a beer monthly   . Drug use: No    Frequency: 7.0 times per week  . Sexual activity: Yes  Other Topics Concern  . Not on file  Social History Narrative   Works for [employer]   + tobacco abuse   2 children- daughter age 93 and  daughter age 38   Married   Grew up in [place], moved here at age 58   Completed high school   Enjoys outdoor activities   Working on Restaurant manager, fast food      Social Determinants of Radio broadcast assistant Strain: Not on [employer] Insecurity: Not on file  Transportation Needs: Not on file  Physical Activity: Not on file  Stress: Not on file  Social Connections: Not on file  Intimate Partner Violence: Not on file    Past Surgical History:  Procedure Laterality Date  . KNEE SURGERY Right 12/29/13  . NO PAST SURGERIES     denies surgical history  . WISDOM TOOTH EXTRACTION  01/18/2016    Family History  Problem Relation Age of Onset  . Hypertension Mother   . Healthy Father   . Healthy Brother   . Sudden death Neg Hx   . Hyperlipidemia Neg Hx   . Heart attack Neg Hx   . Diabetes Neg Hx     Allergies  Allergen Reactions  . Bee Venom Shortness Of Breath    Yellow Jacket  . Penicillins     Mother allergic to it. Pt never taken it.    Current Outpatient Medications on File Prior to Visit  Medication Sig Dispense Refill  . amphetamine-dextroamphetamine (ADDERALL) 20 MG tablet Take 1 tablet (20 mg total) by mouth 2 (two) times  daily. 60 tablet 0  . EPINEPHrine 0.3 mg/0.3 mL IJ SOAJ injection Inject 0.3 mLs (0.3 mg total) into the muscle once. 2 Device 1   No current facility-administered medications on file prior to visit.    BP 128/85   Pulse 70   Temp 97.9 F (36.6 C) (Oral)   Resp 18   Ht 6\' 1"  (1.854 m)   Wt 224 lb (101.6 kg)   SpO2 100%   BMI 29.55 kg/m    Objective:   Physical Exam  Physical Exam  Constitutional: He is oriented to person, place, and time. He appears well-developed and well-nourished. No distress.  HENT:  Head: Normocephalic and atraumatic.  Right Ear: Tympanic membrane and ear canal normal.  Left Ear: Tympanic membrane and ear canal normal.  Mouth/Throat: not examined- pt wearing mask Eyes: Pupils are equal, round, and reactive  to light. No scleral icterus.  Neck: Normal range of motion. No thyromegaly present.  Cardiovascular: Normal rate and regular rhythm.   No murmur heard. Pulmonary/Chest: Effort normal and breath sounds normal. No respiratory distress. He has no wheezes. He has no rales. He exhibits no tenderness.  Abdominal: Soft. Bowel sounds are normal. He exhibits no distension and no mass. There is no tenderness. There is no rebound and no guarding.  Musculoskeletal: He exhibits no edema.  Lymphadenopathy:    He has no cervical adenopathy.  Neurological: He is alert and oriented to person, place, and time. He has normal patellar reflexes. He exhibits normal muscle tone. Coordination normal.  Skin: Skin is warm and dry.  Psychiatric: He has a normal mood and affect. His behavior is normal. Judgment and thought content normal.           Assessment & Plan:   Preventative care- discussed healthy diet, exercise. Counseled on flu shot and covid vaccine, pt declines both.    Tobacco abuse-  Discussed importance of complete smoking cessation.  He is going to try to quit.    OSA- pt did not tolerate CPAP.  We discussed referral for oral appliance. He will think about it and let me know if he wishes to proceed.   ADHD- controlled substance contract is updated and will obtain updated UDS.  This visit occurred during the SARS-CoV-2 public health emergency.  Safety protocols were in place, including screening questions prior to the visit, additional usage of staff PPE, and extensive cleaning of exam room while observing appropriate contact time as indicated for disinfecting solutions.       Assessment & Plan:

## 2020-01-13 NOTE — Patient Instructions (Signed)
Please complete lab work prior to leaving. Let me know if you would like a referral to have an oral appliance made for your sleep apnea. Please work on completely quitting smoking.

## 2020-01-16 LAB — DRUG MONITORING, PANEL 8 WITH CONFIRMATION, URINE
6 Acetylmorphine: NEGATIVE ng/mL (ref <10)
Alcohol Metabolites: NEGATIVE ng/mL
Amphetamine: 1170 ng/mL — ABNORMAL HIGH (ref <250)
Amphetamines: POSITIVE ng/mL — AB (ref <500)
Benzodiazepines: NEGATIVE ng/mL (ref <100)
Buprenorphine, Urine: NEGATIVE ng/mL (ref <5)
Cocaine Metabolite: NEGATIVE ng/mL (ref <150)
Creatinine: 65 mg/dL
MDMA: NEGATIVE ng/mL (ref <500)
Marijuana Metabolite: NEGATIVE ng/mL (ref <20)
Methamphetamine: NEGATIVE ng/mL (ref <250)
Opiates: NEGATIVE ng/mL (ref <100)
Oxidant: NEGATIVE ug/mL
Oxycodone: NEGATIVE ng/mL (ref <100)
pH: 7.3 (ref 4.5–9.0)

## 2020-01-16 LAB — DM TEMPLATE

## 2020-01-26 ENCOUNTER — Telehealth: Payer: Self-pay | Admitting: Family

## 2020-01-26 MED ORDER — AMPHETAMINE-DEXTROAMPHETAMINE 20 MG PO TABS: 20.0000 mg | ORAL_TABLET | Freq: Two times a day (BID) | ORAL | 0 refills | Status: DC

## 2020-01-26 NOTE — Telephone Encounter (Signed)
Requesting: Adderall 20mg  Contract: 10/31/2018 UDS: 01/13/2020 Last Visit: 01/13/2020 Next Visit: None Last Refill:12/28/2019 #60 and 0RF  Joel Pt.   Please Advise

## 2020-01-26 NOTE — Telephone Encounter (Signed)
Medication: amphetamine-dextroamphetamine (ADDERALL) 20 MG tablet [[id number]]   Has the patient contacted their pharmacy? No. (If no, request that the patient contact the pharmacy for the refill.) (If yes, when and what did the pharmacy advise?)  Preferred Pharmacy (with phone number or street name):  [id number] - [place], [place] - [address] AT [facility] [address]  [address], [place] [address]  Phone:  [phone] Fax:  [phone]  DEA #:  [id number]  DAW Reason: --     Agent: Please be advised that RX refills may take up to 3 business days. We ask that you follow-up with your pharmacy.

## 2020-02-25 ENCOUNTER — Other Ambulatory Visit: Payer: Self-pay | Admitting: Family

## 2020-02-25 NOTE — Telephone Encounter (Signed)
Requesting: Adderall Contract: 01/13/20 UDS: 01/13/20 Last Visit: 01/13/20 Next Visit:  none Last Refill: 01/26/2020  Please Advise

## 2020-02-25 NOTE — Telephone Encounter (Signed)
Medication:  amphetamine-dextroamphetamine (ADDERALL) 20 MG tablet   Has the patient contacted their pharmacy? No. (If no, request that the patient contact the pharmacy for the refill.) (If yes, when and what did the pharmacy advise?)  Preferred Pharmacy (with phone number or street name):  [facility] #[id number] - [place], [place] - [address]  [address] [place], [place] [address]  Phone:  [phone] Fax:  [phone]  DEA #:  [id number]  [place] Reason: --     Agent: Please be advised that RX refills may take up to 3 business days. We ask that you follow-up with your pharmacy.

## 2020-02-26 MED ORDER — AMPHETAMINE-DEXTROAMPHETAMINE 20 MG PO TABS: 20.0000 mg | ORAL_TABLET | Freq: Two times a day (BID) | ORAL | 0 refills | Status: DC

## 2020-03-25 ENCOUNTER — Telehealth: Payer: Self-pay | Admitting: Family

## 2020-03-25 NOTE — Telephone Encounter (Signed)
Medication: amphetamine-dextroamphetamine (ADDERALL) 20 MG tablet    Has the patient contacted their pharmacy? No. (If no, request that the patient contact the pharmacy for the refill.) (If yes, when and what did the pharmacy advise?)  Preferred Pharmacy (with phone number or street name): [facility] #[id number] - [place], [place] - [address]  [address] [place], [place] [address]  Phone:  [phone] Fax:  [phone]  DEA #:  [id number]  Agent: Please be advised that RX refills may take up to 3 business days. We ask that you follow-up with your pharmacy.

## 2020-03-25 NOTE — Telephone Encounter (Signed)
Last RX: 02/26/2020 Last OV: 15/15/2021 Next OV: none on schedule UDS: 01/13/2020 CSC: 01/13/2020 CSR: no discrepancies

## 2020-03-27 MED ORDER — AMPHETAMINE-DEXTROAMPHETAMINE 20 MG PO TABS: 20.0000 mg | ORAL_TABLET | Freq: Two times a day (BID) | ORAL | 0 refills | Status: DC

## 2020-03-30 NOTE — Telephone Encounter (Signed)
Rx setn 03-27-20

## 2020-04-25 DIAGNOSIS — H00024 Hordeolum internum left upper eyelid: Secondary | ICD-10-CM | POA: Diagnosis not present

## 2020-04-29 ENCOUNTER — Other Ambulatory Visit: Payer: Self-pay | Admitting: Family

## 2020-04-29 NOTE — Telephone Encounter (Signed)
Medication: amphetamine-dextroamphetamine (ADDERALL) 20 MG tablet [[id number]]       Has the patient contacted their pharmacy?  (If no, request that the patient contact the pharmacy for the refill.) (If yes, when and what did the pharmacy advise?)     Preferred Pharmacy (with phone number or street name): [facility] #[id number] - [place], [place] - [address]  [address] [place], [place] [address]  Phone:  [phone] Fax:  [phone]      Agent: Please be advised that RX refills may take up to 3 business days. We ask that you follow-up with your pharmacy.

## 2020-04-29 NOTE — Telephone Encounter (Signed)
Requesting: Adderall 20mg  Contract: 10/31/2018 UDS: 01/13/2020 Last Visit: 01/13/2020 Next Visit: None Last Refill: 03/27/2020 #60 and 0RF  Please Advise

## 2020-04-30 MED ORDER — AMPHETAMINE-DEXTROAMPHETAMINE 20 MG PO TABS: 20.0000 mg | ORAL_TABLET | Freq: Two times a day (BID) | ORAL | 0 refills | Status: DC

## 2020-05-27 ENCOUNTER — Other Ambulatory Visit: Payer: Self-pay | Admitting: Family

## 2020-05-27 MED ORDER — AMPHETAMINE-DEXTROAMPHETAMINE 20 MG PO TABS: 20.0000 mg | ORAL_TABLET | Freq: Two times a day (BID) | ORAL | 0 refills | Status: DC

## 2020-05-27 NOTE — Telephone Encounter (Signed)
Last RX:  04-29-2020 Last OV:  01-13-2020 Next OV: due in June, not scheduled UDS:  01-13-2020 CSC:01-13-2020

## 2020-05-27 NOTE — Telephone Encounter (Signed)
Medication: amphetamine-dextroamphetamine (ADDERALL) 20 MG tablet    Has the patient contacted their pharmacy? No. (If no, request that the patient contact the pharmacy for the refill.) (If yes, when and what did the pharmacy advise?)  Preferred Pharmacy (with phone number or street name): [facility] #[id number] - [place], [place] - [address]  [address], [place] [address]  Phone:  [phone] Fax:  [phone]  DEA #:  [id number]  Agent: Please be advised that RX refills may take up to 3 business days. We ask that you follow-up with your pharmacy.  

## 2020-06-16 DIAGNOSIS — S60041A Contusion of right ring finger without damage to nail, initial encounter: Secondary | ICD-10-CM | POA: Diagnosis not present

## 2020-07-01 ENCOUNTER — Other Ambulatory Visit: Payer: Self-pay

## 2020-07-01 ENCOUNTER — Ambulatory Visit: Payer: BC Managed Care – PPO | Admitting: Family

## 2020-07-01 ENCOUNTER — Encounter: Payer: Self-pay | Admitting: Family

## 2020-07-01 DIAGNOSIS — F988 Other specified behavioral and emotional disorders with onset usually occurring in childhood and adolescence: Secondary | ICD-10-CM | POA: Diagnosis not present

## 2020-07-01 DIAGNOSIS — F17200 Nicotine dependence, unspecified, uncomplicated: Secondary | ICD-10-CM

## 2020-07-01 MED ORDER — AMPHETAMINE-DEXTROAMPHETAMINE 20 MG PO TABS: 20.0000 mg | ORAL_TABLET | Freq: Two times a day (BID) | ORAL | 0 refills | Status: DC

## 2020-07-01 NOTE — Assessment & Plan Note (Signed)
Discussed importance of complete smoking cessation.

## 2020-07-01 NOTE — Progress Notes (Signed)
Subjective:   By signing my name below, I, Shehryar Baig, attest that this documentation has been prepared under the direction and in the presence of [name] NP. 07/01/2020      Patient ID: Joel [name], male    DOB: 12/18/1985, 35 y.o.   MRN: [id number]  No chief complaint on file.   HPI Patient is in today for a office visit. He is requesting a refill on 20 mg Adderall 2x daily PO. He has had no side effects from using that medication. He is doing well at work. He now smokes 1 pack a month. He only smokes on the weekends at social settings.   Exercise- He participates in exercise daily.  Diet- He manages a healthy diet. Wt Readings from Last 3 Encounters:  07/01/20 222 lb 3.2 oz (100.8 kg)  01/13/20 224 lb (101.6 kg)  07/17/19 228 lb (103.4 kg)      Health Maintenance Due  Topic Date Due  . Pneumococcal Vaccine 54-77 Years old (1 of 2 - PPSV23) Never done    Past Medical History:  Diagnosis Date  . ADD (attention deficit disorder)   . Hyperlipemia   . Obstructive sleep apnea 09/10/2013   OSA per split night study 10/15, settings 12 cm h20 full face mask.     . Tobacco abuse     Past Surgical History:  Procedure Laterality Date  . KNEE SURGERY Right 12/29/13  . NO PAST SURGERIES     denies surgical history  . WISDOM TOOTH EXTRACTION  01/18/2016    Family History  Problem Relation Age of Onset  . Hypertension Mother   . Healthy Father   . Healthy Brother   . CAD Paternal Grandmother        died at 56  . Sudden death Neg Hx   . Hyperlipidemia Neg Hx   . Heart attack Neg Hx   . Diabetes Neg Hx     Social History   Socioeconomic History  . Marital status: Married    Spouse name: Not on file  . Number of children: Not on file  . Years of education: Not on file  . Highest education level: Not on file  Occupational History  . Not on file  Tobacco Use  . Smoking status: Current Every Day Smoker    Packs/day: 0.50  . Smokeless tobacco:  Never Used  . Tobacco comment: <1/2 pack  per day  Substance and Sexual Activity  . Alcohol use: Yes    Comment: case a beer monthly   . Drug use: No    Frequency: 7.0 times per week  . Sexual activity: Yes  Other Topics Concern  . Not on file  Social History Narrative   Works for [employer]   + tobacco abuse   2 children- daughter age 72 and daughter age 56   Married   Grew up in [place], moved here at age 72   Completed high school   Enjoys outdoor activities   Working on Restaurant manager, fast food      Social Determinants of Radio broadcast assistant Strain: Not on [employer] Insecurity: Not on file  Transportation Needs: Not on file  Physical Activity: Not on file  Stress: Not on file  Social Connections: Not on file  Intimate Partner Violence: Not on file    Outpatient Medications Prior to Visit  Medication Sig Dispense Refill  . amphetamine-dextroamphetamine (ADDERALL) 20 MG tablet Take 1 tablet (20 mg  total) by mouth 2 (two) times daily. 60 tablet 0  . EPINEPHrine 0.3 mg/0.3 mL IJ SOAJ injection Inject 0.3 mLs (0.3 mg total) into the muscle once. 2 Device 1   No facility-administered medications prior to visit.    Allergies  Allergen Reactions  . Bee Venom Shortness Of Breath    Yellow Jacket  . Penicillins     Mother allergic to it. Pt never taken it.    ROS     Objective:    Physical Exam Constitutional:      General: He is not in acute distress.    Appearance: Normal appearance. He is not ill-appearing.  HENT:     Head: Normocephalic and atraumatic.     Right Ear: External ear normal.     Left Ear: External ear normal.  Eyes:     Extraocular Movements: Extraocular movements intact.     Pupils: Pupils are equal, round, and reactive to light.  Cardiovascular:     Rate and Rhythm: Normal rate and regular rhythm.     Pulses: Normal pulses.     Heart sounds: Normal heart sounds. No murmur heard. No gallop.    Pulmonary:     Effort: Pulmonary effort is normal. No respiratory distress.     Breath sounds: Normal breath sounds. No wheezing, rhonchi or rales.  Skin:    General: Skin is warm and dry.  Neurological:     Mental Status: He is alert and oriented to person, place, and time.  Psychiatric:        Behavior: Behavior normal.     There were no vitals taken for this visit. Wt Readings from Last 3 Encounters:  01/13/20 224 lb (101.6 kg)  07/17/19 228 lb (103.4 kg)  11/04/18 230 lb (104.3 kg)       Assessment & Plan:   Problem List Items Addressed This Visit   None      No orders of the defined types were placed in this encounter.   I, [name] NP, personally preformed the services described in this documentation.  All medical record entries made by the scribe were at my direction and in my presence.  I have reviewed the chart and discharge instructions (if applicable) and agree that the record reflects my personal performance and is accurate and complete. 07/01/2020   I,Shehryar Baig,acting as a Education administrator for Nance Pear, NP.,have documented all relevant documentation on the behalf of Nance Pear, NP,as directed by  Nance Pear, NP while in the presence of Nance Pear, NP.   Shehryar [employer]

## 2020-07-01 NOTE — Assessment & Plan Note (Signed)
Stable on adderall 20mg  bid. Continue same.

## 2020-07-05 ENCOUNTER — Telehealth: Payer: Self-pay | Admitting: Family

## 2020-07-05 NOTE — Telephone Encounter (Signed)
rx was last sent 07-01-20

## 2020-07-05 NOTE — Telephone Encounter (Signed)
Medication: amphetamine-dextroamphetamine (ADDERALL) 20 MG tablet [[id number]]    Has the patient contacted their pharmacy? no (If no, request that the patient contact the pharmacy for the refill.) (If yes, when and what did the pharmacy advise?)    Preferred Pharmacy (with phone number or street name): [facility] #[id number] - [place], [place] - [address]  [address] [place], [place] [address]  Phone:  [phone] Fax:  [phone]     Agent: Please be advised that RX refills may take up to 3 business days. We ask that you follow-up with your pharmacy.

## 2020-07-26 DIAGNOSIS — H05011 Cellulitis of right orbit: Secondary | ICD-10-CM | POA: Diagnosis not present

## 2020-07-26 DIAGNOSIS — H00012 Hordeolum externum right lower eyelid: Secondary | ICD-10-CM | POA: Diagnosis not present

## 2020-08-03 ENCOUNTER — Telehealth: Payer: Self-pay | Admitting: Family

## 2020-08-03 NOTE — Telephone Encounter (Signed)
Medication: amphetamine-dextroamphetamine (ADDERALL) 20 MG tablet [[id number]]     Has the patient contacted their pharmacy? No. (If no, request that the patient contact the pharmacy for the refill.) (If yes, when and what did the pharmacy advise?)     Preferred Pharmacy (with phone number or street name): [facility] #[id number] - [place], [place] - [address]  [address] [place], [place] [address]  Phone:  [phone]  Fax:  [phone]  DEA #:  [id number]     Agent: Please be advised that RX refills may take up to 3 business days. We ask that you follow-up with your pharmacy.

## 2020-08-04 MED ORDER — AMPHETAMINE-DEXTROAMPHETAMINE 20 MG PO TABS: 20.0000 mg | ORAL_TABLET | Freq: Two times a day (BID) | ORAL | 0 refills | Status: DC

## 2020-08-04 NOTE — Addendum Note (Signed)
Addended by: [name] on: 08/04/2020 09:23 PM   Modules accepted: Orders

## 2020-08-04 NOTE — Telephone Encounter (Signed)
Last RX:07-01-2020 Last OV:07-01-2020 Next OV: no future UDS:01-13-2020 CSC:01-13-2020

## 2020-09-02 ENCOUNTER — Telehealth: Payer: Self-pay

## 2020-09-02 MED ORDER — AMPHETAMINE-DEXTROAMPHETAMINE 20 MG PO TABS: 20.0000 mg | ORAL_TABLET | Freq: Two times a day (BID) | ORAL | 0 refills | Status: DC

## 2020-09-02 NOTE — Telephone Encounter (Signed)
Requesting:adderall 20 mg Contract:01/13/20 UDS:01/13/20 Last Visit:07/01/20 Next Visit:unknown Last Refill:08/04/20  Please Advise

## 2020-09-06 NOTE — Telephone Encounter (Signed)
Please re-send the rx to [facility] [address] since his [facility] is out of the medication today. They have also canceled it at that pharmacy.

## 2020-09-07 MED ORDER — AMPHETAMINE-DEXTROAMPHETAMINE 20 MG PO TABS: 20.0000 mg | ORAL_TABLET | Freq: Two times a day (BID) | ORAL | 0 refills | Status: DC

## 2020-09-07 NOTE — Telephone Encounter (Signed)
Rx was cancelled

## 2020-09-07 NOTE — Telephone Encounter (Signed)
Please cancel adderall Rx at [facility].

## 2020-09-07 NOTE — Addendum Note (Signed)
Addended by: [name] on: 09/07/2020 08:38 AM   Modules accepted: Orders

## 2020-10-07 IMAGING — DX DG ANKLE COMPLETE 3+V*R*
3 series · 3 of 3 positions shown · non-contrast
Comparison: None.

CLINICAL DATA: Right ankle pain since a twisting injury on
11/02/2018. The patient fell down steps.

EXAM:
RIGHT ANKLE - COMPLETE 3+ VIEW

[ankle ap]
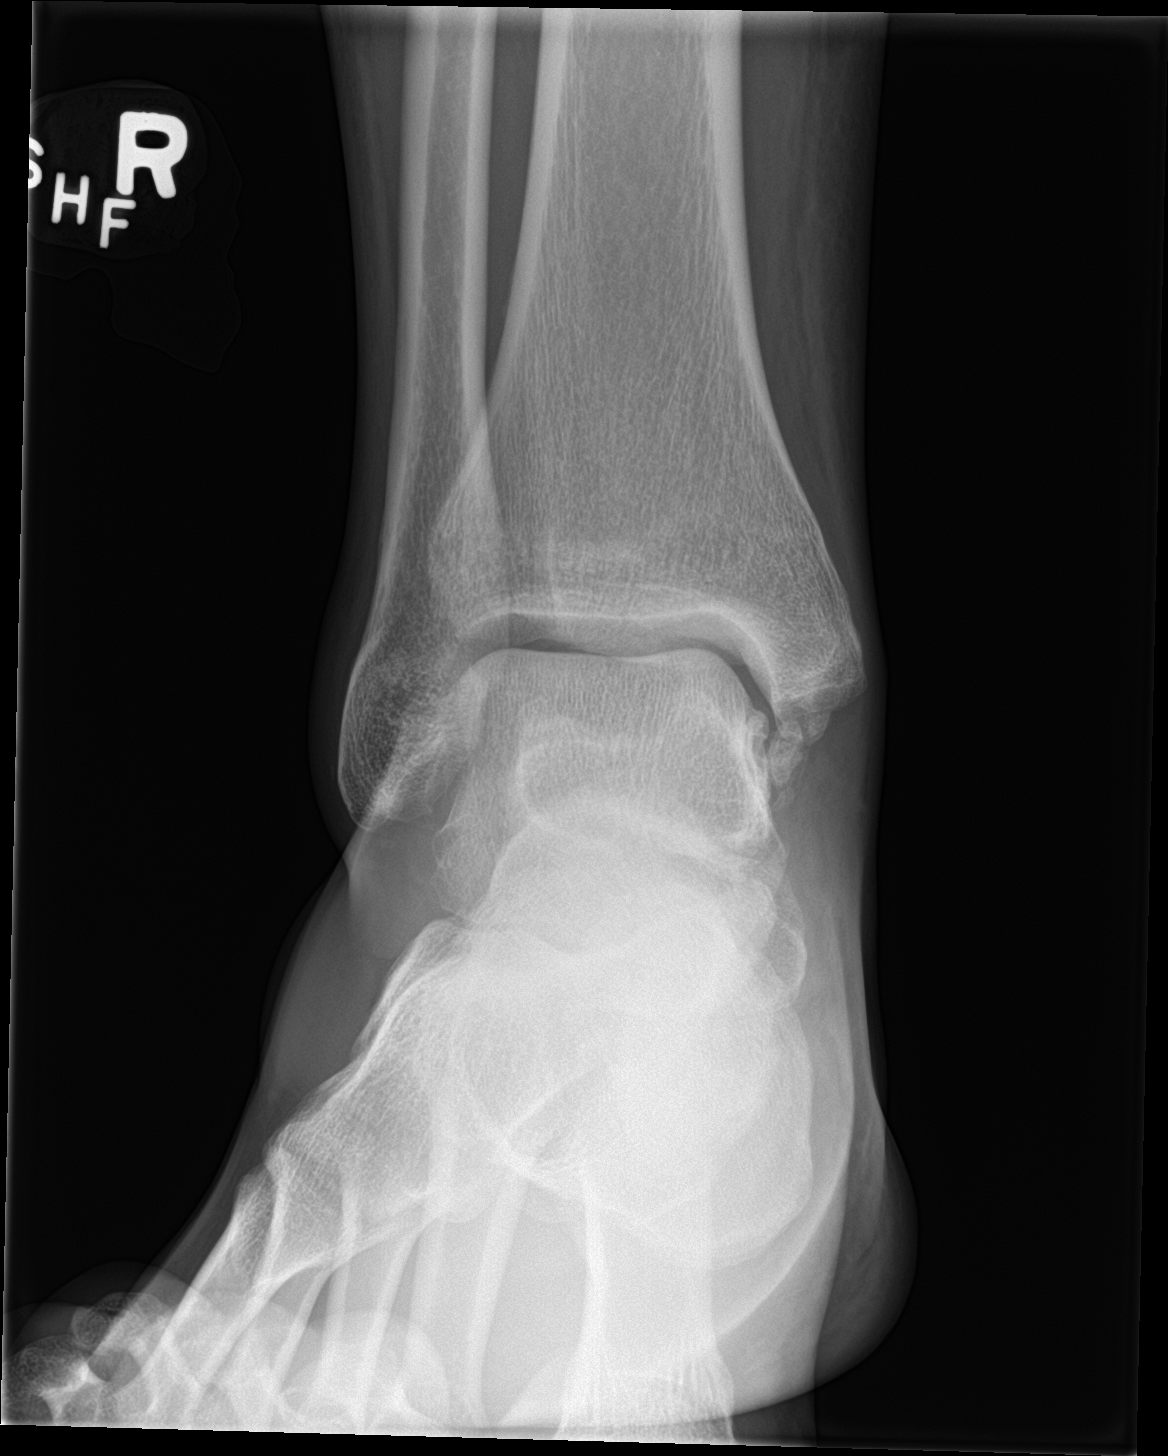

[ankle obl]
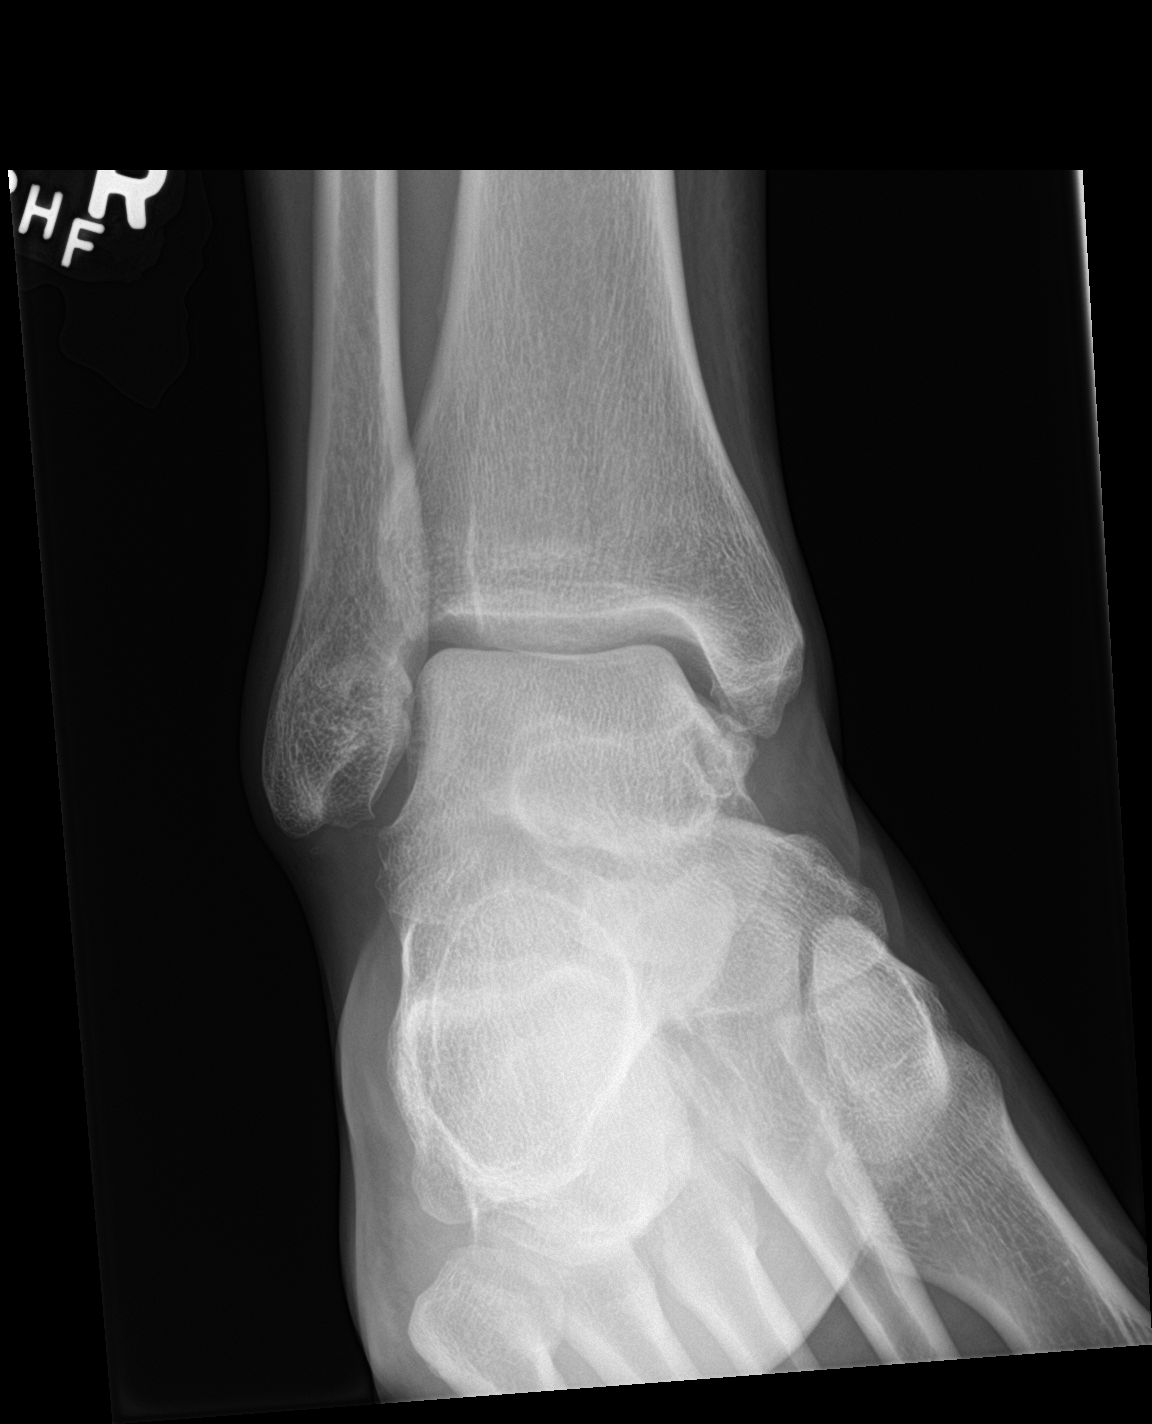

[ankle lat]
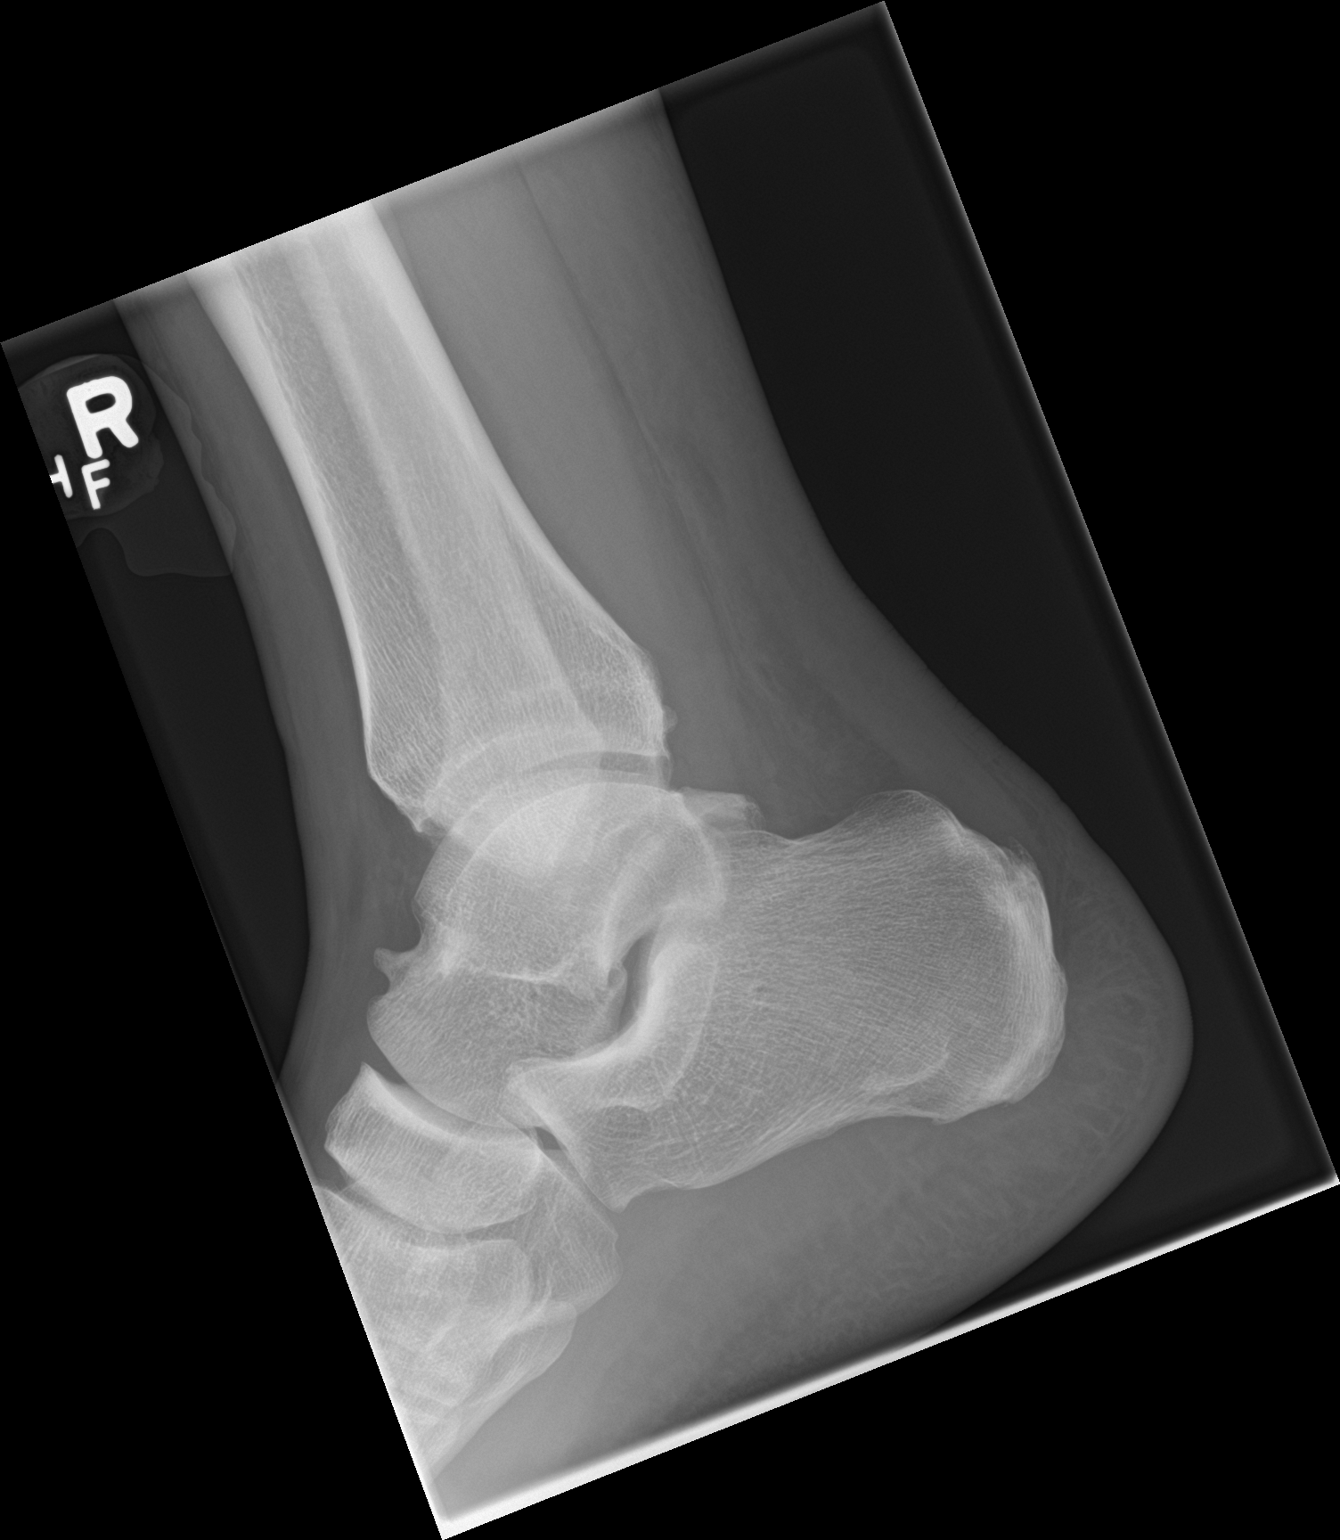

[3 of 3 positions shown; findings below may reference images not displayed]

FINDINGS: There is no fracture or dislocation or appreciable ankle effusion.
There are ossifications adjacent to the tip of the medial malleolus
consistent with prior sprains. Dorsal spurring on the distal talus.
Small osteophytes on the anterior and posterior aspects of the
distal tibia.
IMPRESSION: 1. No acute abnormalities.
2. Findings consistent with prior sprain at the medial malleolus.

## 2020-10-14 ENCOUNTER — Telehealth: Payer: Self-pay | Admitting: Family

## 2020-10-14 ENCOUNTER — Other Ambulatory Visit: Payer: Self-pay | Admitting: Family Medicine

## 2020-10-14 MED ORDER — AMPHETAMINE-DEXTROAMPHETAMINE 20 MG PO TABS: 20.0000 mg | ORAL_TABLET | Freq: Two times a day (BID) | ORAL | 0 refills | Status: DC

## 2020-10-14 NOTE — Telephone Encounter (Signed)
Requesting:adderall 20 mg Contract:01/13/20 UDS:01/13/20 Last Visit:07/01/20 Next Visit:unknown Last Refill:09/07/20

## 2020-10-14 NOTE — Telephone Encounter (Signed)
Medication: amphetamine-dextroamphetamine (ADDERALL) 20 MG tablet   Has the patient contacted their pharmacy? No. (If no, request that the patient contact the pharmacy for the refill.) (If yes, when and what did the pharmacy advise?)  Preferred Pharmacy (with phone number or street name): [id number] [address] [id number] Phone:  [phone]  Agent: Please be advised that RX refills may take up to 3 business days. We ask that you follow-up with your pharmacy.

## 2020-11-10 ENCOUNTER — Telehealth: Payer: Self-pay | Admitting: Family

## 2020-11-10 NOTE — Telephone Encounter (Signed)
Medication:  amphetamine-dextroamphetamine (ADDERALL) 20 MG tablet   Has the patient contacted their pharmacy? No. (If no, request that the patient contact the pharmacy for the refill.) (If yes, when and what did the pharmacy advise?)    Preferred Pharmacy (with phone number or street name):  [facility] #[id number] - [place], [place] - [address]  [address] [place], [place] [address]  Phone:  [phone]  Fax:  [phone]

## 2020-11-11 NOTE — Telephone Encounter (Signed)
Requesting:adderall 20 mg Contract:01/13/20 UDS:01/13/20 Last Visit:07/01/20 Next Visit:unknown Last Refill:10/14/20

## 2020-11-12 MED ORDER — AMPHETAMINE-DEXTROAMPHETAMINE 20 MG PO TABS: 20.0000 mg | ORAL_TABLET | Freq: Two times a day (BID) | ORAL | 0 refills | Status: DC

## 2020-11-12 NOTE — Addendum Note (Signed)
Addended by: [name] on: 11/12/2020 10:27 PM   Modules accepted: Orders

## 2020-12-15 ENCOUNTER — Telehealth: Payer: Self-pay | Admitting: Family

## 2020-12-15 NOTE — Telephone Encounter (Signed)
Requesting: Adderall 20mg  Contract: 01/13/20 UDS:01/13/20 Last Visit: 07/01/20 Next Visit: none Last Refill: 11/12/20  Please Advise

## 2020-12-15 NOTE — Telephone Encounter (Signed)
Pt requesting refill  Medication: amphetamine-dextroamphetamine (ADDERALL) 20 MG tablet   Has the patient contacted their pharmacy? No.   Preferred Pharmacy: [facility][id number] - [place], [place] - [address] AT [facility] [address]  [address], [place] [address]  Phone:  [phone]  Fax:  [phone]

## 2020-12-16 MED ORDER — AMPHETAMINE-DEXTROAMPHETAMINE 20 MG PO TABS: 20.0000 mg | ORAL_TABLET | Freq: Two times a day (BID) | ORAL | 0 refills | Status: DC

## 2021-01-17 ENCOUNTER — Telehealth: Payer: Self-pay | Admitting: Family

## 2021-01-17 NOTE — Telephone Encounter (Signed)
Medication:  amphetamine-dextroamphetamine (ADDERALL) 20 MG tablet  Has the patient contacted their pharmacy? No. (If no, request that the patient contact the pharmacy for the refill.) (If yes, when and what did the pharmacy advise?)  Preferred Pharmacy (with phone number or street name):  [facility] #[id number] - [place], [place] - [address]  [address] [place], [place]  [address]  Phone:  [phone]  Fax:  [phone]   Agent: Please be advised that RX refills may take up to 3 business days. We ask that you follow-up with your pharmacy.

## 2021-01-18 MED ORDER — AMPHETAMINE-DEXTROAMPHETAMINE 20 MG PO TABS: 20.0000 mg | ORAL_TABLET | Freq: Two times a day (BID) | ORAL | 0 refills | Status: DC

## 2021-01-18 NOTE — Telephone Encounter (Signed)
Refill sent. Pt will need OV prior  additional refills.

## 2021-01-18 NOTE — Telephone Encounter (Signed)
Called patient but no answer and voice mail not set up, MyChart message sent for him to call and set up appointment within the next  30 days.

## 2021-01-18 NOTE — Telephone Encounter (Signed)
Patient called back and is scheduled for January 3rd

## 2021-01-18 NOTE — Telephone Encounter (Signed)
Pt is unable to get an appointment until 1/3 and would like a partial refill of his adderall to hold him over, please advise.

## 2021-01-18 NOTE — Telephone Encounter (Signed)
January 3rd ok, patient was given 30 days supply of his medication

## 2021-01-31 ENCOUNTER — Ambulatory Visit: Payer: BC Managed Care – PPO | Admitting: Family

## 2021-02-06 ENCOUNTER — Ambulatory Visit: Payer: BC Managed Care – PPO | Admitting: Family

## 2021-02-14 ENCOUNTER — Ambulatory Visit: Payer: BC Managed Care – PPO | Admitting: Family

## 2021-02-14 VITALS — BP 117/77 | HR 70 | Temp 97.8°F | Resp 16 | Wt 235.0 lb

## 2021-02-14 DIAGNOSIS — F909 Attention-deficit hyperactivity disorder, unspecified type: Secondary | ICD-10-CM

## 2021-02-14 MED ORDER — AMPHETAMINE-DEXTROAMPHETAMINE 20 MG PO TABS: 20.0000 mg | ORAL_TABLET | Freq: Two times a day (BID) | ORAL | 0 refills | Status: DC

## 2021-02-14 NOTE — Progress Notes (Signed)
Subjective:     Patient ID: Joel [name], male    DOB: Oct 19, 1985, 36 y.o.   MRN: [id number]  Chief Complaint  Patient presents with   ADHD    Here for follow up last UDS and [place] 15/15/21    HPI Patient is in today for follow up.  Reports that he continues his ADD medicine (Adderall) and that his ADD is well controlled.  He has put on a bit of weight since the last visit and he plans to work on this.   Wt Readings from Last 3 Encounters:  02/14/21 235 lb (106.6 kg)  07/01/20 222 lb 3.2 oz (100.8 kg)  01/13/20 224 lb (101.6 kg)     Health Maintenance Due  Topic Date Due   Pneumococcal Vaccine 97-75 Years old (1 - PCV) Never done   TETANUS/TDAP  08/09/2020    Past Medical History:  Diagnosis Date   ADD (attention deficit disorder)    Hyperlipemia    Obstructive sleep apnea 09/10/2013   OSA per split night study 10/15, settings 12 cm h20 full face mask.      Tobacco abuse     Past Surgical History:  Procedure Laterality Date   KNEE SURGERY Right 12/29/13   NO PAST SURGERIES     denies surgical history   WISDOM TOOTH EXTRACTION  01/18/2016    Family History  Problem Relation Age of Onset   Hypertension Mother    Healthy Father    Healthy Brother    CAD Paternal Grandmother        died at 71   Sudden death Neg Hx    Hyperlipidemia Neg Hx    Heart attack Neg Hx    Diabetes Neg Hx     Social History   Socioeconomic History   Marital status: Married    Spouse name: Not on file   Number of children: Not on file   Years of education: Not on file   Highest education level: Not on file  Occupational History   Not on file  Tobacco Use   Smoking status: Every Day    Packs/day: 0.50    Types: Cigarettes   Smokeless tobacco: Never   Tobacco comments:    <1/2 pack  per day  Substance and Sexual Activity   Alcohol use: Yes    Comment: case a beer monthly    Drug use: No    Frequency: 7.0 times per week   Sexual activity: Yes  Other Topics Concern    Not on file  Social History Narrative   Works for [employer]   + tobacco abuse   2 children- daughter age 15 and daughter age 39   Married   Grew up in [place], moved here at age 65   Completed high school   Enjoys outdoor activities   Working on Restaurant manager, fast food      Social Determinants of Radio broadcast assistant Strain: Not on Art therapist Insecurity: Not on file  Transportation Needs: Not on file  Physical Activity: Not on file  Stress: Not on file  Social Connections: Not on file  Intimate Partner Violence: Not on file    Outpatient Medications Prior to Visit  Medication Sig Dispense Refill   EPINEPHrine 0.3 mg/0.3 mL IJ SOAJ injection Inject 0.3 mLs (0.3 mg total) into the muscle once. 2 Device 1   amphetamine-dextroamphetamine (ADDERALL) 20 MG tablet Take 1 tablet (20 mg total) by mouth 2 (two) times  daily. 60 tablet 0   No facility-administered medications prior to visit.    Allergies  Allergen Reactions   Bee Venom Shortness Of Breath    Yellow Jacket   Penicillins     Mother allergic to it. Pt never taken it.    ROS See HPI    Objective:    Physical Exam Constitutional:      General: He is not in acute distress.    Appearance: He is well-developed.  HENT:     Head: Normocephalic and atraumatic.  Cardiovascular:     Rate and Rhythm: Normal rate and regular rhythm.     Heart sounds: No murmur heard. Pulmonary:     Effort: Pulmonary effort is normal. No respiratory distress.     Breath sounds: Normal breath sounds. No wheezing or rales.  Skin:    General: Skin is warm and dry.  Neurological:     Mental Status: He is alert and oriented to person, place, and time.  Psychiatric:        Behavior: Behavior normal.        Thought Content: Thought content normal.    BP 117/77 (BP Location: Right Arm, Patient Position: Sitting, Cuff Size: Large)    Pulse 70    Temp 97.8 F (36.6 C) (Oral)    Resp 16    Wt 235 lb  (106.6 kg)    SpO2 99%    BMI 31.00 kg/m  Wt Readings from Last 3 Encounters:  02/14/21 235 lb (106.6 kg)  07/01/20 222 lb 3.2 oz (100.8 kg)  01/13/20 224 lb (101.6 kg)       Assessment & Plan:   Problem List Items Addressed This Visit       Unprioritized   ADD (attention deficit disorder) - Primary    Stable. Continue current dose of Adderall. Pt is advised to continue current dose. UDS will be updated today and controlled substance contract has been updated.       Relevant Orders   DRUG MONITORING, PANEL 8 WITH CONFIRMATION, URINE    I am having Joel [name] "Joel [name]" maintain his EPINEPHrine and amphetamine-dextroamphetamine.  Meds ordered this encounter  Medications   amphetamine-dextroamphetamine (ADDERALL) 20 MG tablet    Sig: Take 1 tablet (20 mg total) by mouth 2 (two) times daily.    Dispense:  60 tablet    Refill:  0    Supervising MD:  Penni Homans   DEA: [id number]    Order Specific Question:   Supervising Provider    Answer:   Penni Homans A [id number]

## 2021-02-14 NOTE — Patient Instructions (Signed)
Please complete lab work prior to leaving.   

## 2021-02-14 NOTE — Assessment & Plan Note (Signed)
Stable. Continue current dose of Adderall. Pt is advised to continue current dose. UDS will be updated today and controlled substance contract has been updated.

## 2021-02-17 LAB — DRUG MONITORING, PANEL 8 WITH CONFIRMATION, URINE
6 Acetylmorphine: NEGATIVE ng/mL (ref <10)
Alcohol Metabolites: NEGATIVE ng/mL (ref <500)
Amphetamine: >15000 ng/mL — ABNORMAL HIGH (ref <250)
Amphetamines: POSITIVE ng/mL — AB (ref <500)
Benzodiazepines: NEGATIVE ng/mL (ref <100)
Buprenorphine, Urine: NEGATIVE ng/mL (ref <5)
Cocaine Metabolite: NEGATIVE ng/mL (ref <150)
Creatinine: 216.5 mg/dL (ref >=20.0)
MDMA: NEGATIVE ng/mL (ref <500)
Marijuana Metabolite: NEGATIVE ng/mL (ref <20)
Methamphetamine: NEGATIVE ng/mL (ref <250)
Opiates: NEGATIVE ng/mL (ref <100)
Oxidant: NEGATIVE ug/mL (ref <200)
Oxycodone: NEGATIVE ng/mL (ref <100)
pH: 6.1 (ref 4.5–9.0)

## 2021-02-17 LAB — DM TEMPLATE

## 2021-03-23 ENCOUNTER — Telehealth: Payer: Self-pay

## 2021-03-23 NOTE — Telephone Encounter (Signed)
Medication: Adderall 20mg    Has the patient contacted their pharmacy? No. (If no, request that the patient contact the pharmacy for the refill.) (If yes, when and what did the pharmacy advise?)  Preferred Pharmacy (with phone number or street name): [name]  Agent: Please be advised that RX refills may take up to 3 business days. We ask that you follow-up with your pharmacy.

## 2021-03-24 MED ORDER — AMPHETAMINE-DEXTROAMPHETAMINE 20 MG PO TABS: 20.0000 mg | ORAL_TABLET | Freq: Two times a day (BID) | ORAL | 0 refills | Status: DC

## 2021-04-24 ENCOUNTER — Telehealth: Payer: Self-pay | Admitting: Family

## 2021-04-24 MED ORDER — AMPHETAMINE-DEXTROAMPHETAMINE 20 MG PO TABS: 20.0000 mg | ORAL_TABLET | Freq: Two times a day (BID) | ORAL | 0 refills | Status: DC

## 2021-04-24 NOTE — Addendum Note (Signed)
Addended by: [name] on: 04/24/2021 11:01 AM ? ? Modules accepted: Orders ? ?

## 2021-04-24 NOTE — Telephone Encounter (Signed)
Medication: amphetamine-dextroamphetamine (ADDERALL) 20 MG tablet  ? ?Has the patient contacted their pharmacy? Yes.   ? ?Preferred Pharmacy (with phone number or street name):  ?[facility] #[id number] - [place], [place] - [address]  ?[address], [place] [address]  ?Phone:  [phone]  Fax:  [phone]  ? ?Agent: Please be advised that RX refills may take up to 3 business days. We ask that you follow-up with your pharmacy.  ?

## 2021-04-24 NOTE — Telephone Encounter (Signed)
Last RX:03/20/21 ?Last OV:02/14/21 ?Next XV:QMGQ scheduled ?UDS:02/14/21 ?CSC:02/14/21 ?

## 2021-05-24 ENCOUNTER — Telehealth: Payer: Self-pay | Admitting: Family

## 2021-05-24 NOTE — Telephone Encounter (Signed)
Pt called stating he needed a refill on his adderall. Please Advise. ? ?Medication:  ? ?amphetamine-dextroamphetamine (ADDERALL) 20 MG tablet [[id number]]  ? ?Has the patient contacted their pharmacy? No. ?(If no, request that the patient contact the pharmacy for the refill.) ?(If yes, when and what did the pharmacy advise?) ? ?Preferred Pharmacy (with phone number or street name):  ? ?[facility] #[id number] - [place], [place] - [address]  ?[address], [place] [address]  ?Phone:  [phone]  Fax:  [phone]  ? ?Agent: Please be advised that RX refills may take up to 3 business days. We ask that you follow-up with your pharmacy. ? ?

## 2021-05-25 MED ORDER — AMPHETAMINE-DEXTROAMPHETAMINE 20 MG PO TABS: 20.0000 mg | ORAL_TABLET | Freq: Two times a day (BID) | ORAL | 0 refills | Status: DC

## 2021-05-25 NOTE — Telephone Encounter (Signed)
Last OV:02/14/21 ?Next OV:05/23/21 ?UDS:02/14/21 ?CSC:02/14/21 ?

## 2021-05-25 NOTE — Addendum Note (Signed)
Addended by: [name] on: 05/25/2021 12:31 PM ? ? Modules accepted: Orders ? ?

## 2021-05-30 ENCOUNTER — Ambulatory Visit: Payer: BC Managed Care – PPO | Admitting: Family

## 2021-06-02 ENCOUNTER — Ambulatory Visit: Payer: BC Managed Care – PPO | Admitting: Family

## 2021-06-07 ENCOUNTER — Ambulatory Visit: Payer: BC Managed Care – PPO | Admitting: Family

## 2021-06-07 DIAGNOSIS — F988 Other specified behavioral and emotional disorders with onset usually occurring in childhood and adolescence: Secondary | ICD-10-CM

## 2021-06-07 NOTE — Progress Notes (Signed)
? ?Subjective:  ? ?By signing my name below, I, Shehryar Baig, attest that this documentation has been prepared under the direction and in the presence of [name] NP. 06/07/2021 ? ? ? Patient ID: Joel [name], male    DOB: 01-25-1986, 36 y.o.   MRN: [id number] ? ?Chief Complaint  ?Patient presents with  ? ADHD  ?  Here for follow up  ? ? ?HPI ?Patient is in today for a follow up visit.  ? ?ADHD- He continues taking 20 mg adderall and reports doing well while taking it. He reports no new issues while taking it. He updated his contract in January.  ? ?Blood pressure- His blood pressure is doing well during this visit.  ?Pulse Readings from Last 3 Encounters:  ?06/07/21 72  ?02/14/21 70  ?07/01/20 65  ? ?BP Readings from Last 3 Encounters:  ?06/07/21 133/76  ?02/14/21 117/77  ?07/01/20 131/89  ? ? ?Health Maintenance Due  ?Topic Date Due  ? TETANUS/TDAP  08/09/2020  ? ? ?Past Medical History:  ?Diagnosis Date  ? ADD (attention deficit disorder)   ? Hyperlipemia   ? Obstructive sleep apnea 09/10/2013  ? OSA per split night study 10/15, settings 12 cm h20 full face mask.     ? Tobacco abuse   ? ? ?Past Surgical History:  ?Procedure Laterality Date  ? KNEE SURGERY Right 12/29/13  ? NO PAST SURGERIES    ? denies surgical history  ? WISDOM TOOTH EXTRACTION  01/18/2016  ? ? ?Family History  ?Problem Relation Age of Onset  ? Hypertension Mother   ? Healthy Father   ? Healthy Brother   ? CAD Paternal Grandmother   ?     died at 70  ? Sudden death Neg Hx   ? Hyperlipidemia Neg Hx   ? Heart attack Neg Hx   ? Diabetes Neg Hx   ? ? ?Social History  ? ?Socioeconomic History  ? Marital status: Married  ?  Spouse name: Not on file  ? Number of children: Not on file  ? Years of education: Not on file  ? Highest education level: Not on file  ?Occupational History  ? Not on file  ?Tobacco Use  ? Smoking status: Every Day  ?  Packs/day: 0.50  ?  Types: Cigarettes  ? Smokeless tobacco: Never  ? Tobacco comments:  ?  <1/2 pack   per day  ?Substance and Sexual Activity  ? Alcohol use: Yes  ?  Comment: case a beer monthly   ? Drug use: No  ?  Frequency: 7.0 times per week  ? Sexual activity: Yes  ?Other Topics Concern  ? Not on file  ?Social History Narrative  ? 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 + tobacco abuse  ? 2 children- daughter age 30 and daughter age 57  ? Married  ? Grew up in [place], moved here at age 51  ? Completed high school  ? Enjoys outdoor activities  ? Working on Restaurant manager, fast food  ?   ? ?Social Determinants of Health  ? ?Financial Resource Strain: Not on file  ?Food Insecurity: Not on file  ?Transportation Needs: Not on file  ?Physical Activity: Not on file  ?Stress: Not on file  ?Social Connections: Not on file  ?Intimate Partner Violence: Not on file  ? ? ?Outpatient Medications Prior to Visit  ?Medication Sig Dispense Refill  ? amphetamine-dextroamphetamine (ADDERALL) 20 MG tablet Take 1 tablet (20 mg total) by mouth 2 (  two) times daily. 60 tablet 0  ? EPINEPHrine 0.3 mg/0.3 mL IJ SOAJ injection Inject 0.3 mLs (0.3 mg total) into the muscle once. 2 Device 1  ? ?No facility-administered medications prior to visit.  ? ? ?Allergies  ?Allergen Reactions  ? Bee Venom Shortness Of Breath  ?  Yellow Jacket  ? Penicillins   ?  Mother allergic to it. Pt never taken it.  ? ? ?ROS ? ?  See HPI  ? ?Objective:  ?  ?Physical Exam ?Constitutional:   ?   General: He is not in acute distress. ?   Appearance: Normal appearance. He is not ill-appearing.  ?HENT:  ?   Head: Normocephalic and atraumatic.  ?   Right Ear: External ear normal.  ?   Left Ear: External ear normal.  ?Eyes:  ?   Extraocular Movements: Extraocular movements intact.  ?   Pupils: Pupils are equal, round, and reactive to light.  ?Cardiovascular:  ?   Rate and Rhythm: Normal rate and regular rhythm.  ?   Heart sounds: Normal heart sounds. No murmur heard. ?  No gallop.  ?Pulmonary:  ?   Effort: Pulmonary effort is normal. No respiratory distress.   ?   Breath sounds: Normal breath sounds. No wheezing or rales.  ?Skin: ?   General: Skin is warm and dry.  ?Neurological:  ?   Mental Status: He is alert and oriented to person, place, and time.  ?Psychiatric:     ?   Judgment: Judgment normal.  ? ? ?BP 133/76 (BP Location: Right Arm, Patient Position: Sitting, Cuff Size: Large)   Pulse 72   Temp 98.3 ?F (36.8 ?C) (Oral)   Resp 16   Ht 6' (1.829 m)   Wt 233 lb (105.7 kg)   SpO2 100%   BMI 31.60 kg/m?  ?Wt Readings from Last 3 Encounters:  ?06/07/21 233 lb (105.7 kg)  ?02/14/21 235 lb (106.6 kg)  ?07/01/20 222 lb 3.2 oz (100.8 kg)  ? ? ?   ?Assessment & Plan:  ? ?Problem List Items Addressed This Visit   ? ?  ? Unprioritized  ? ADD (attention deficit disorder)  ?  Stable/controlled on Adderall '20mg'$  bid. Continue current dose. UDS and controlled substance contracts are up to date. ? ?  ?  ? ? ? ?No orders of the defined types were placed in this encounter. ? ? ?I, Nance Pear, NP, personally preformed the services described in this documentation.  All medical record entries made by the scribe were at my direction and in my presence.  I have reviewed the chart and discharge instructions (if applicable) and agree that the record reflects my personal performance and is accurate and complete. 06/07/2021 ? ? ?Engineering geologist as a Education administrator for [employer], NP.,have documented all relevant documentation on the behalf of Nance Pear, NP,as directed by  Nance Pear, NP while in the presence of Nance Pear, NP. ? ? ?Nance Pear, NP ? ?

## 2021-06-07 NOTE — Assessment & Plan Note (Signed)
Stable/controlled on Adderall '20mg'$  bid. Continue current dose. UDS and controlled substance contracts are up to date. ?

## 2021-06-23 ENCOUNTER — Telehealth: Payer: Self-pay | Admitting: Family

## 2021-06-23 MED ORDER — AMPHETAMINE-DEXTROAMPHETAMINE 20 MG PO TABS: 20.0000 mg | ORAL_TABLET | Freq: Two times a day (BID) | ORAL | 0 refills | Status: DC

## 2021-06-23 NOTE — Telephone Encounter (Signed)
Medication:   amphetamine-dextroamphetamine (ADDERALL) 20 MG tablet [[id number]]   Has the patient contacted their pharmacy? No. (If no, request that the patient contact the pharmacy for the refill.) (If yes, when and what did the pharmacy advise?)  Preferred Pharmacy (with phone number or street name):   [facility] #[id number] - [place], [place] - [address]  [address] [place], [place] [address]  Phone:  [phone]  Fax:  [phone]   Agent: Please be advised that RX refills may take up to 3 business days. We ask that you follow-up with your pharmacy.

## 2021-06-23 NOTE — Addendum Note (Signed)
Addended by: [name] on: 06/23/2021 04:31 PM   Modules accepted: Orders

## 2021-06-28 NOTE — Telephone Encounter (Signed)
Pt stated pharmacy has no received refill. Please advise.

## 2021-08-14 ENCOUNTER — Telehealth: Payer: Self-pay | Admitting: Family

## 2021-08-14 NOTE — Telephone Encounter (Signed)
Medication: amphetamine-dextroamphetamine (ADDERALL) 20 MG tablet   Has the patient contacted their pharmacy? No.  Preferred Pharmacy: [facility] #[id number] - [place], [place] - [address] AT [facility] [address]   [address], [place] [address]  Phone:  [phone]  Fax:  [phone]

## 2021-08-18 MED ORDER — AMPHETAMINE-DEXTROAMPHETAMINE 20 MG PO TABS: 20.0000 mg | ORAL_TABLET | Freq: Two times a day (BID) | ORAL | 0 refills | Status: DC

## 2021-08-18 NOTE — Addendum Note (Signed)
Addended by: [name] on: 08/18/2021 02:19 PM   Modules accepted: Orders

## 2021-09-21 ENCOUNTER — Telehealth: Payer: Self-pay | Admitting: Family

## 2021-09-21 MED ORDER — AMPHETAMINE-DEXTROAMPHETAMINE 20 MG PO TABS: 20.0000 mg | ORAL_TABLET | Freq: Two times a day (BID) | ORAL | 0 refills | Status: DC

## 2021-09-21 NOTE — Telephone Encounter (Signed)
Medication:   amphetamine-dextroamphetamine (ADDERALL) 20 MG tablet [[id number]]   Has the patient contacted their pharmacy? No. (If no, request that the patient contact the pharmacy for the refill.) (If yes, when and what did the pharmacy advise?)  Preferred Pharmacy (with phone number or street name):   [facility] #[id number] - [place], [place] - [address]  [address] [place], [place] [address]  Phone:  [phone]  Fax:  [phone]   Agent: Please be advised that RX refills may take up to 3 business days. We ask that you follow-up with your pharmacy.

## 2021-09-21 NOTE — Telephone Encounter (Signed)
RX:08/18/2021  Last OV:06/07/2021  Next OV:not scheduled  UDS:02/14/2021  CSC:02/14/2021

## 2021-10-20 ENCOUNTER — Telehealth: Payer: Self-pay | Admitting: Family

## 2021-10-20 MED ORDER — AMPHETAMINE-DEXTROAMPHETAMINE 20 MG PO TABS: 20.0000 mg | ORAL_TABLET | Freq: Two times a day (BID) | ORAL | 0 refills | Status: DC

## 2021-10-20 NOTE — Addendum Note (Signed)
Addended by: [name] on: 10/20/2021 03:46 PM   Modules accepted: Orders

## 2021-10-20 NOTE — Telephone Encounter (Signed)
Medication: amphetamine-dextroamphetamine (ADDERALL) 20 MG tablet   Has the patient contacted their pharmacy? No.  Preferred Pharmacy (with phone number or street name):  [facility][id number] - [place], [place] - [address], [place] [address] Phone: [phone]  Fax: [phone]  

## 2021-10-27 MED ORDER — AMPHETAMINE-DEXTROAMPHETAMINE 20 MG PO TABS: 20.0000 mg | ORAL_TABLET | Freq: Two times a day (BID) | ORAL | 0 refills | Status: DC

## 2021-10-27 NOTE — Addendum Note (Signed)
Addended by: [name] on: 10/27/2021 05:02 PM   Modules accepted: Orders

## 2021-10-27 NOTE — Telephone Encounter (Signed)
Pt states that pharmacy did not have rx and [facility]   Medication: amphetamine-dextroamphetamine (ADDERALL) 20 MG tablet   Has the patient contacted their pharmacy? Yes.    [facility] [address], [place], [place] [address]

## 2021-10-27 NOTE — Telephone Encounter (Signed)
Left message to cancel adderall rx at [facility] sent on 9/22.

## 2021-11-21 ENCOUNTER — Telehealth: Payer: Self-pay | Admitting: Family

## 2021-11-21 NOTE — Telephone Encounter (Signed)
Medication: amphetamine-dextroamphetamine (ADDERALL) 20 MG tablet  Has the patient contacted their pharmacy? No.   [facility]  [id number]: [phone] Fax: [phone]

## 2021-11-22 MED ORDER — AMPHETAMINE-DEXTROAMPHETAMINE 20 MG PO TABS: 20.0000 mg | ORAL_TABLET | Freq: Two times a day (BID) | ORAL | 0 refills | Status: DC

## 2021-11-22 NOTE — Telephone Encounter (Signed)
Last RX:10-27-2021 Last OV:06-07-21 Next UF:CZGQ scheduled UDS:02/14/21 CSC:02/14/21

## 2021-11-22 NOTE — Addendum Note (Signed)
Addended by: [name] on: 11/22/2021 12:58 PM   Modules accepted: Orders

## 2021-12-25 ENCOUNTER — Telehealth: Payer: Self-pay | Admitting: Family

## 2021-12-25 NOTE — Telephone Encounter (Signed)
Medication: amphetamine-dextroamphetamine (ADDERALL) 20 MG tablet   Has the patient contacted their pharmacy? No.  Preferred Pharmacy (with phone number or street name):  [facility] #[id number] - [place], [place] - [address] AT [place] [address] [place], [place] [address] Phone: [phone]  Fax: [phone]

## 2021-12-26 ENCOUNTER — Telehealth: Payer: Self-pay | Admitting: Family

## 2021-12-26 MED ORDER — AMPHETAMINE-DEXTROAMPHETAMINE 20 MG PO TABS: 20.0000 mg | ORAL_TABLET | Freq: Two times a day (BID) | ORAL | 0 refills | Status: DC

## 2021-12-26 NOTE — Telephone Encounter (Signed)
Pt scheduled  

## 2021-12-26 NOTE — Addendum Note (Signed)
Addended by: [name] on: 12/26/2021 08:38 AM   Modules accepted: Orders

## 2021-12-26 NOTE — Telephone Encounter (Signed)
Please contact pt to schedule cpx.

## 2021-12-26 NOTE — Telephone Encounter (Signed)
Last RX:11-22-2021 Last OV:06-07-21 Next SU:PJSR scheduled UDS:02/14/21 CSC:02/14/21 Past due for physical

## 2022-01-02 ENCOUNTER — Encounter: Payer: Self-pay | Admitting: Family

## 2022-01-02 ENCOUNTER — Ambulatory Visit (INDEPENDENT_AMBULATORY_CARE_PROVIDER_SITE_OTHER): Payer: BC Managed Care – PPO | Admitting: Family

## 2022-01-02 VITALS — BP 123/75 | HR 65 | Temp 97.5°F | Resp 16 | Ht 72.0 in | Wt 231.0 lb

## 2022-01-02 DIAGNOSIS — Z0000 Encounter for general adult medical examination without abnormal findings: Secondary | ICD-10-CM

## 2022-01-02 DIAGNOSIS — Z136 Encounter for screening for cardiovascular disorders: Secondary | ICD-10-CM | POA: Diagnosis not present

## 2022-01-02 DIAGNOSIS — Z91030 Bee allergy status: Secondary | ICD-10-CM

## 2022-01-02 DIAGNOSIS — F17200 Nicotine dependence, unspecified, uncomplicated: Secondary | ICD-10-CM

## 2022-01-02 DIAGNOSIS — Z23 Encounter for immunization: Secondary | ICD-10-CM

## 2022-01-02 DIAGNOSIS — F988 Other specified behavioral and emotional disorders with onset usually occurring in childhood and adolescence: Secondary | ICD-10-CM

## 2022-01-02 LAB — COMPREHENSIVE METABOLIC PANEL
ALT: 53 U/L (ref 0–53)
AST: 23 U/L (ref 0–37)
Albumin: 4.3 g/dL (ref 3.5–5.2)
Alkaline Phosphatase: 80 U/L (ref 39–117)
BUN: 13 mg/dL (ref 6–23)
CO2: 26 mEq/L (ref 19–32)
Calcium: 9.2 mg/dL (ref 8.4–10.5)
Chloride: 105 mEq/L (ref 96–112)
Creatinine, Ser: 0.95 mg/dL (ref 0.40–1.50)
GFR: 101.33 mL/min (ref >60.00)
Glucose, Bld: 110 mg/dL — ABNORMAL HIGH (ref 70–99)
Potassium: 4.3 mEq/L (ref 3.5–5.1)
Sodium: 138 mEq/L (ref 135–145)
Total Bilirubin: 0.4 mg/dL (ref 0.2–1.2)
Total Protein: 6.6 g/dL (ref 6.0–8.3)

## 2022-01-02 LAB — CBC WITH DIFFERENTIAL/PLATELET
Basophils Absolute: 0 10*3/uL (ref 0.0–0.1)
Basophils Relative: 0.5 % (ref 0.0–3.0)
Eosinophils Absolute: 0.1 10*3/uL (ref 0.0–0.7)
Eosinophils Relative: 1.3 % (ref 0.0–5.0)
HCT: 45 % (ref 39.0–52.0)
Hemoglobin: 15.5 g/dL (ref 13.0–17.0)
Lymphocytes Relative: 37.3 % (ref 12.0–46.0)
Lymphs Abs: 2.7 10*3/uL (ref 0.7–4.0)
MCHC: 34.5 g/dL (ref 30.0–36.0)
MCV: 95.4 fl (ref 78.0–100.0)
Monocytes Absolute: 0.4 10*3/uL (ref 0.1–1.0)
Monocytes Relative: 6.1 % (ref 3.0–12.0)
Neutro Abs: 3.9 10*3/uL (ref 1.4–7.7)
Neutrophils Relative %: 54.8 % (ref 43.0–77.0)
Platelets: 310 10*3/uL (ref 150.0–400.0)
RBC: 4.71 Mil/uL (ref 4.22–5.81)
RDW: 12.8 % (ref 11.5–15.5)
WBC: 7.2 10*3/uL (ref 4.0–10.5)

## 2022-01-02 LAB — LIPID PANEL
Cholesterol: 190 mg/dL (ref 0–200)
HDL: 36.2 mg/dL — ABNORMAL LOW (ref >39.00)
NonHDL: 154.13
Total CHOL/HDL Ratio: 5
Triglycerides: 212 mg/dL — ABNORMAL HIGH (ref 0.0–149.0)
VLDL: 42.4 mg/dL — ABNORMAL HIGH (ref 0.0–40.0)

## 2022-01-02 LAB — TSH: TSH: 1.04 u[IU]/mL (ref 0.35–5.50)

## 2022-01-02 LAB — LDL CHOLESTEROL, DIRECT: Direct LDL: 131 mg/dL

## 2022-01-02 NOTE — Progress Notes (Signed)
Subjective:   By signing my name below, I, [name], attest that this documentation has been prepared under the direction and in the presence of Joel Chimera, NP 01/02/2022     Patient ID: Joel [name], male    DOB: February 09, 1985, 36 y.o.   MRN: [id number]  Chief Complaint  Patient presents with   Annual Exam    HPI Patient is in today for a comprehensive physical exam  Adderall: He reports that he is responding well to his 20 mg of Adderall medication and denies of any significant side effects.  Blood Pressure: His blood pressure during today's visit is normal.  BP Readings from Last 3 Encounters:  01/02/22 123/75  06/07/21 133/76  02/14/21 117/77   Pulse Readings from Last 3 Encounters:  01/02/22 65  06/07/21 72  02/14/21 70   Sleep Apnea: He reports that he does have sleep apnea and regularly wears a Cpap to bed.   He denies having any fever, hearing or vision symptoms, new muscle pain, joint pain, new moles, rashes, congestion, sinus pain, sore throat, palpations, cough, SOB ,wheezing,n/v/d constipation, blood in stool, dysuria, frequency, hematuria, depression, anxiety, headaches at this time  Social history: He reports no recent surgeries. He denies of any changes to his family medical history. He is continuing to smoke. He has quit in the past in about 2019 but has re-continued. He is not interested in trying Chantix for the time being and would rather focus on quitting on his own.  Immunizations: He is due for a tetanus vaccine and is interested in receiving the vaccine during today's visit Diet: He is maintaining a healthy diet.  Exercise: He goes to the gym about three times a week.  Dental: He is UTD on dental exams. Vision: He is UTD on vision exams.   Health Maintenance Due  Topic Date Due   DTaP/Tdap/Td (2 - Td or Tdap) 08/09/2020    Past Medical History:  Diagnosis Date   ADD (attention deficit disorder)    Hyperlipemia    Obstructive  sleep apnea 09/10/2013   OSA per split night study 10/15, settings 12 cm h20 full face mask.      Tobacco abuse     Past Surgical History:  Procedure Laterality Date   KNEE SURGERY Right 12/29/13   NO PAST SURGERIES     denies surgical history   WISDOM TOOTH EXTRACTION  01/18/2016    Family History  Problem Relation Age of Onset   Hypertension Mother    Healthy Father    Healthy Brother    CAD Paternal Grandmother        died at 33   Sudden death Neg Hx    Hyperlipidemia Neg Hx    Heart attack Neg Hx    Diabetes Neg Hx     Social History   Socioeconomic History   Marital status: Married    Spouse name: Not on file   Number of children: Not on file   Years of education: Not on file   Highest education level: Not on file  Occupational History   Not on file  Tobacco Use   Smoking status: Every Day    Packs/day: 0.50    Types: Cigarettes   Smokeless tobacco: Never   Tobacco comments:    <1/2 pack  per day  Substance and Sexual Activity   Alcohol use: Yes    Comment: case a beer monthly    Drug use: No    Frequency: 7.0  times per week   Sexual activity: Yes    Partners: Female  Other Topics Concern   Not on file  Social History Narrative   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   + tobacco abuse   2 children- daughter age 1 and daughter age 59   Married   Grew up in [place], moved here at age 89   Completed high school   Enjoys outdoor activities   Working on Restaurant manager, fast food      Social Determinants of Radio broadcast assistant Strain: Not on Art therapist Insecurity: Not on Pensions consultant Needs: Not on file  Physical Activity: Not on file  Stress: Not on file  Social Connections: Not on file  Intimate Partner Violence: Not on file    Outpatient Medications Prior to Visit  Medication Sig Dispense Refill   amphetamine-dextroamphetamine (ADDERALL) 20 MG tablet Take 1 tablet (20 mg total) by mouth 2 (two) times daily. 60 tablet  0   EPINEPHrine 0.3 mg/0.3 mL IJ SOAJ injection Inject 0.3 mLs (0.3 mg total) into the muscle once. 2 Device 1   No facility-administered medications prior to visit.    Allergies  Allergen Reactions   Bee Venom Shortness Of Breath    Yellow Jacket   Penicillins     Mother allergic to it. Pt never taken it.    Review of Systems  Constitutional:  Negative for fever.  HENT:  Negative for congestion, sinus pain and sore throat.   Respiratory:  Negative for cough, shortness of breath and wheezing.   Cardiovascular:  Negative for palpitations.  Gastrointestinal:  Negative for blood in stool, constipation, diarrhea, nausea and vomiting.  Genitourinary:  Negative for dysuria, frequency and hematuria.  Musculoskeletal:  Negative for joint pain and myalgias.  Skin:  Negative for rash.       (-) [place]oles  Neurological:  Negative for headaches.  Psychiatric/Behavioral:  Negative for depression. The patient is not nervous/anxious.        Objective:    Physical Exam Constitutional:      General: He is not in acute distress.    Appearance: Normal appearance. He is not ill-appearing.  HENT:     Head: Normocephalic and atraumatic.     Right Ear: Tympanic membrane, ear canal and external ear normal.     Left Ear: Tympanic membrane, ear canal and external ear normal.     Mouth/Throat:     Pharynx: Posterior oropharyngeal erythema present.  Eyes:     Extraocular Movements: Extraocular movements intact.     Pupils: Pupils are equal, round, and reactive to light.  Cardiovascular:     Rate and Rhythm: Normal rate and regular rhythm.     Heart sounds: Normal heart sounds. No murmur heard.    No gallop.  Pulmonary:     Effort: Pulmonary effort is normal. No respiratory distress.     Breath sounds: Normal breath sounds. No wheezing or rales.  Abdominal:     General: Bowel sounds are normal. There is no distension.     Palpations: Abdomen is soft.     Tenderness: There is no abdominal  tenderness. There is no guarding.  Musculoskeletal:     Comments: 5/5 strength in both upper and lower extremities    Skin:    General: Skin is warm and dry.  Neurological:     Mental Status: He is alert and oriented to person, place, and time.     Deep Tendon Reflexes:  Reflex Scores:      Patellar reflexes are 0 on the right side and 0 on the left side. Psychiatric:        Mood and Affect: Mood normal.        Behavior: Behavior normal.        Judgment: Judgment normal.     BP 123/75 (BP Location: Right Arm, Patient Position: Sitting, Cuff Size: Large)   Pulse 65   Temp (!) 97.5 F (36.4 C) (Oral)   Resp 16   Ht 6' (1.829 m)   Wt 231 lb (104.8 kg)   SpO2 97%   BMI 31.33 kg/m  Wt Readings from Last 3 Encounters:  01/02/22 231 lb (104.8 kg)  06/07/21 233 lb (105.7 kg)  02/14/21 235 lb (106.6 kg)       Assessment & Plan:   Problem List Items Addressed This Visit       Unprioritized   Tobacco use disorder    Discussed importance of smoking cessation.  Offered, trial of chantix. He declines.  Not currently ready to quit but plans to do "cold [place]" when he is ready.       Routine general medical examination at a health care facility - Primary    Vision and dental up to date.  Continue regular diet and exercise.       Relevant Orders   Comp Met (CMET)   Lipid panel   TSH   CBC with Differential/Platelet   Allergy to bee sting    Has updated epipen.        ADD (attention deficit disorder)    Stable on Adderall.  Continue same. Controlled substance contract is updated.   Obtain UDS.        Relevant Orders   DRUG MONITORING, PANEL 8 WITH CONFIRMATION, URINE   No orders of the defined types were placed in this encounter.   I, Nance Pear, NP, personally preformed the services described in this documentation.  All medical record entries made by the scribe were at my direction and in my presence.  I have reviewed the chart and discharge  instructions (if applicable) and agree that the record reflects my personal performance and is accurate and complete. 01/02/2022   I,Amber Collins,acting as a scribe for Nance Pear, NP.,have documented all relevant documentation on the behalf of Nance Pear, NP,as directed by  Nance Pear, NP while in the presence of Nance Pear, NP.    Nance Pear, NP

## 2022-01-02 NOTE — Assessment & Plan Note (Addendum)
Stable on Adderall.  Continue same. Controlled substance contract is updated.   Obtain UDS.

## 2022-01-02 NOTE — Assessment & Plan Note (Addendum)
Has updated epipen.

## 2022-01-02 NOTE — Assessment & Plan Note (Addendum)
Vision and dental up to date.  Continue regular diet and exercise.

## 2022-01-02 NOTE — Assessment & Plan Note (Signed)
Discussed importance of smoking cessation.  Offered, trial of chantix. He declines.  Not currently ready to quit but plans to do "cold [place]" when he is ready.

## 2022-01-02 NOTE — Addendum Note (Signed)
Addended by: Jiles Prows on: 01/02/2022 02:00 PM   Modules accepted: Orders

## 2022-01-04 LAB — DRUG MONITORING, PANEL 8 WITH CONFIRMATION, URINE
6 Acetylmorphine: NEGATIVE ng/mL (ref <10)
Alcohol Metabolites: NEGATIVE ng/mL (ref <500)
Amphetamine: >15000 ng/mL — ABNORMAL HIGH (ref <250)
Amphetamines: POSITIVE ng/mL — AB (ref <500)
Benzodiazepines: NEGATIVE ng/mL (ref <100)
Buprenorphine, Urine: NEGATIVE ng/mL (ref <5)
Cocaine Metabolite: NEGATIVE ng/mL (ref <150)
Creatinine: 101.6 mg/dL (ref >=20.0)
MDMA: NEGATIVE ng/mL (ref <500)
Marijuana Metabolite: NEGATIVE ng/mL (ref <20)
Methamphetamine: NEGATIVE ng/mL (ref <250)
Opiates: NEGATIVE ng/mL (ref <100)
Oxidant: NEGATIVE ug/mL (ref <200)
Oxycodone: NEGATIVE ng/mL (ref <100)
pH: 6.6 (ref 4.5–9.0)

## 2022-01-04 LAB — DM TEMPLATE

## 2022-01-30 ENCOUNTER — Telehealth: Payer: Self-pay | Admitting: Family

## 2022-01-30 NOTE — Telephone Encounter (Signed)
Prescription Request  01/30/2022  Is this a "Controlled Substance" medicine? Yes  LOV: 01/02/2022  What is the name of the medication or equipment?   amphetamine-dextroamphetamine (ADDERALL) 20 MG tablet [[id number]]   Have you contacted your pharmacy to request a refill? No   Which pharmacy would you like this sent to?   [id number] - [place], [place] - [address] [place] [address] Phone: [phone] Fax: [phone]  Patient notified that their request is being sent to the clinical staff for review and that they should receive a response within 2 business days.   Please advise at Mobile [phone] (mobile)

## 2022-02-01 MED ORDER — AMPHETAMINE-DEXTROAMPHETAMINE 20 MG PO TABS: 20.0000 mg | ORAL_TABLET | Freq: Two times a day (BID) | ORAL | 0 refills | Status: DC

## 2022-03-06 ENCOUNTER — Telehealth: Payer: Self-pay | Admitting: Family

## 2022-03-06 NOTE — Telephone Encounter (Signed)
Prescription Request  03/06/2022  Is this a "Controlled Substance" medicine? No  LOV: 01/02/2022  What is the name of the medication or equipment?   amphetamine-dextroamphetamine (ADDERALL) 20 MG tablet [[id number]]   Have you contacted your pharmacy to request a refill? No   Which pharmacy would you like this sent to?   [facility] [address], [place],  [place]  [address] P: [phone]  Patient notified that their request is being sent to the clinical staff for review and that they should receive a response within 2 business days.   Please advise at Mobile [phone] (mobile)

## 2022-03-07 MED ORDER — AMPHETAMINE-DEXTROAMPHETAMINE 20 MG PO TABS: 20.0000 mg | ORAL_TABLET | Freq: Two times a day (BID) | ORAL | 0 refills | Status: DC

## 2022-03-07 NOTE — Telephone Encounter (Signed)
RX: 02/01/2022 Last OV:01/02/22  Next OV:no future appointments  UDS:01/22/22  CSC:01/02/22

## 2022-03-07 NOTE — Addendum Note (Signed)
Addended by: [name] on: 03/07/2022 02:39 PM   Modules accepted: Orders

## 2022-03-09 ENCOUNTER — Ambulatory Visit (INDEPENDENT_AMBULATORY_CARE_PROVIDER_SITE_OTHER): Payer: 59 | Admitting: Family

## 2022-03-09 VITALS — BP 109/71 | HR 85 | Temp 98.0°F | Resp 16 | Wt 229.0 lb

## 2022-03-09 DIAGNOSIS — F988 Other specified behavioral and emotional disorders with onset usually occurring in childhood and adolescence: Secondary | ICD-10-CM

## 2022-03-09 DIAGNOSIS — G4733 Obstructive sleep apnea (adult) (pediatric): Secondary | ICD-10-CM

## 2022-03-09 NOTE — Assessment & Plan Note (Signed)
Stable on current dose of adderall. Continue same.

## 2022-03-09 NOTE — Assessment & Plan Note (Signed)
Uncontrolled. He is now agreeable to retry CPAP.  His last sleep study was nearly 10 years ago. Recommended that he meet with sleep specialist for evaluation/updated sleep study. Then hopefully can get started back on cpap.

## 2022-03-09 NOTE — Progress Notes (Addendum)
Subjective:   By signing my name below, I, Joel [name], attest that this documentation has been prepared under the direction and in the presence of Joel Alar, NP. 03/09/2022.   Patient ID: Joel [name], male    DOB: Sep 18, 1985, 37 y.o.   MRN: [id number]  Chief Complaint  Patient presents with   ADHD    Here for follow up   Sleep Apnea    Will need cpap    HPI Patient is in today for an office visit.  Sleep Apnea: Currently he does not have a CPAP. He states that he will likely need one soon if he pursues surgical repair of his right knee injury (see below). Lately, he sometimes wakes up not feeling well rested.  Right Knee Injury: Recently he had an injury to his right knee at work. He slipped down a hill while it was raining and suffered a meniscus injury (per recent MRI) of his right knee. Since then, he has completed PT and his pain has resolved. He is able to walk normally and climb stairs without issues. However, if he tries to bend/squat down and then stand up, his knee will "lock up." He has been told that surgery would be required to resolve this.  Adderall: He states he is focusing well on adderall.  Exercise: For exercise he consistently runs and performs weight lifting. Feels much better after exercising.  Denies having any fever, new muscle pain, joint pain, new moles, congestion, sinus pain, sore throat, chest pain, palpitations, cough, SOB, wheezing, n/v/d, constipation, blood in stool, dysuria, frequency, hematuria, at this time.  Past Medical History:  Diagnosis Date   ADD (attention deficit disorder)    Hyperlipemia    Obstructive sleep apnea 09/10/2013   OSA per split night study 10/15, settings 12 cm h20 full face mask.      Tobacco abuse     Past Surgical History:  Procedure Laterality Date   KNEE SURGERY Right 12/29/13   NO PAST SURGERIES     denies surgical history   WISDOM TOOTH EXTRACTION  01/18/2016    Family History  Problem  Relation Age of Onset   Hypertension Mother    Healthy Father    Healthy Brother    CAD Paternal Grandmother        died at 55   Sudden death Neg Hx    Hyperlipidemia Neg Hx    Heart attack Neg Hx    Diabetes Neg Hx     Social History   Socioeconomic History   Marital status: Married    Spouse name: Not on file   Number of children: Not on file   Years of education: Not on file   Highest education level: Not on file  Occupational History   Not on file  Tobacco Use   Smoking status: Every Day    Packs/day: 0.50    Types: Cigarettes   Smokeless tobacco: Never   Tobacco comments:    <1/2 pack  per day  Substance and Sexual Activity   Alcohol use: Yes    Comment: case a beer monthly    Drug use: No    Frequency: 7.0 times per week   Sexual activity: Yes    Partners: Female  Other Topics Concern   Not on file  Social History Narrative   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   + tobacco abuse   2 children- daughter age 40 and daughter age 61   Married   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 60 tablet 0   EPINEPHrine 0.3 mg/0.3 mL IJ SOAJ injection Inject 0.3 mLs (0.3 mg total) into the muscle once. 2 Device 1   No facility-administered medications prior to visit.    Allergies  Allergen Reactions   Bee Venom Shortness Of Breath    Yellow Jacket   Penicillins     Mother allergic to it. Pt never taken it.     Review of Systems  Constitutional:  Negative for fever.  HENT:  Negative for congestion, sinus pain and sore throat.   Respiratory:  Negative for cough, shortness of breath and wheezing.   Cardiovascular:  Negative for chest pain and palpitations.  Gastrointestinal:  Negative for blood in stool, constipation, diarrhea, nausea and vomiting.  Genitourinary:  Negative for dysuria, frequency and hematuria.  Musculoskeletal:  Negative for joint pain and myalgias.       Objective:    Physical Exam Constitutional:      General: He is not in acute distress.    Appearance: Normal appearance. He is not ill-appearing.  HENT:     Head: Normocephalic and atraumatic.     Right Ear: Tympanic membrane, ear canal and external ear normal.     Left Ear: Tympanic membrane, ear canal and external ear normal.  Eyes:     Extraocular Movements: Extraocular movements intact.     Pupils: Pupils are equal, round, and reactive to light.  Cardiovascular:     Rate and Rhythm: Normal rate and regular rhythm.     Heart sounds: Normal heart sounds. No murmur heard.    No gallop.  Pulmonary:     Effort: Pulmonary effort is normal. No respiratory distress.     Breath sounds: Normal breath sounds. No wheezing or rales.  Skin:    General: Skin is warm and dry.  Neurological:     General: No focal deficit present.     Mental Status: He is alert and oriented to person, place, and time.  Psychiatric:        Mood and Affect: Mood normal.        Behavior: Behavior normal.     BP 109/71   Pulse 85   Temp 98 F (36.7 C) (Oral)   Resp 16   Wt 229 lb (103.9 kg)   SpO2 99%   BMI 31.06 kg/m  Wt Readings from Last 3 Encounters:  03/09/22 229 lb (103.9 kg)  01/02/22 231 lb (104.8 kg)  06/07/21 233 lb (105.7 kg)      Assessment & Plan:   Problem List Items Addressed This Visit       Unprioritized   Obstructive sleep apnea    Uncontrolled. He is now agreeable to retry CPAP.  His last sleep study  was nearly 10 years ago. Recommended that he meet with sleep specialist for evaluation/updated sleep study. Then hopefully can get started back  on cpap.       ADD (attention deficit disorder)    Stable on current dose of adderall. Continue same.       Other Visit Diagnoses     OSA (obstructive sleep apnea)    -  Primary   Relevant Orders   Ambulatory referral to Pulmonology        No orders of the defined types were placed in this encounter.   I, Nance Pear, NP, personally preformed the services described in this documentation.  All medical record entries made by the scribe were at my direction and in my presence.  I have reviewed the chart and discharge instructions (if applicable) and agree that the record reflects my personal performance and is accurate and complete. 03/09/2022.  I,Mathew Stumpf,acting as a Education administrator for [employer], NP.,have documented all relevant documentation on the behalf of Nance Pear, NP,as directed by  Nance Pear, NP while in the presence of Nance Pear, NP.   Nance Pear, NP

## 2022-03-14 NOTE — Progress Notes (Signed)
03/19/22- [name]( 39 cigs/ day) for sleep evaluation courtesy of Joel Livings, NP with concern of OSA Medical problem list includes OSA, NASH, ADD, Tobacco Use, Hyperlipidemia, Bee Sting Allergy,  NPSG 11/23/13- AHI 21.5/ hr, CPAP to 12, Body weight 220 lbs -Adderall 20 Epworth score-3 Body weight today-228 lbs Covid vax-no Flu -no -----Pt had sleep study in 2015. States he used cpap machine for 2 months. Would like to explore other options for sleep apnea He is pending surgical knee repair Dr. [name].  He is concerned about timing and his best arrangement may be to have anesthesia put him on a BiPAP machine while in Recovery. He wanted to review options for treating sleep apnea.  We did this.  He is specifically not interested in ENT surgery at this time.  37 Wife does complain of his loud snoring and has witnessed apneas.  We discussed medical issues associated with untreated sleep apnea and he would be willing to try CPAP again if needed.  He might be a candidate for fitted oral appliance. No history of ENT surgery. We discussed medical problems associated with untreated sleep apnea and emphasized driving responsibility.  Prior to Admission medications   Medication Sig Start Date End Date Taking? Authorizing Provider  amphetamine-dextroamphetamine (ADDERALL) 20 MG tablet Take 1 tablet (20 mg total) by mouth 2 (two) times daily. 03/07/22  Yes Joel Alar, NP  EPINEPHrine 0.3 mg/0.3 mL IJ SOAJ injection Inject 0.3 mLs (0.3 mg total) into the muscle once. 07/23/13  Yes Joel Jeans, PA-C   Past Medical History:  Diagnosis Date   ADD (attention deficit disorder)    Hyperlipemia    Obstructive sleep apnea 09/10/2013   OSA per split night study 10/15, settings 12 cm h20 full face mask.      Tobacco abuse    Past Surgical History:  Procedure Laterality Date   KNEE SURGERY Right 12/29/13   NO PAST SURGERIES     denies surgical history   WISDOM TOOTH EXTRACTION   01/18/2016   Family History  Problem Relation Age of Onset   Hypertension Mother    Healthy Father    Healthy Brother    CAD Paternal Grandmother        died at 52   Sudden death Neg Hx    Hyperlipidemia Neg Hx    Heart attack Neg Hx    Diabetes Neg Hx    Social History   Socioeconomic History   Marital status: Married    Spouse name: Not on file   Number of children: Not on file   Years of education: Not on file   Highest education level: Not on file  Occupational History   Not on file  Tobacco Use   Smoking status: Every Day    Packs/day: 0.50    Types: Cigarettes    Passive exposure: Current   Smokeless tobacco: Never   Tobacco comments:    3 cigarettes per day 03/19/2022 PAP  Substance and Sexual Activity   Alcohol use: Yes    Comment: case a beer monthly    Drug use: No    Frequency: 7.0 times per week   Sexual activity: Yes    Partners: Female  Other Topics Concern   Not on file  Social History Narrative   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 HEENT:    headaches, difficulty swallowing, tooth/dental problems, sore throat,       sneezing, itching, ear ache, nasal congestion, post nasal drip, snoring CV:    chest pain, orthopnea, PND, swelling in lower extremities, anasarca,                                   dizziness, palpitations Resp:   shortness of breath  with exertion or at rest.                productive cough,   non-productive cough, coughing up of blood.              change in color of mucus.  wheezing.   Skin:    rash or lesions. GI:  No-   heartburn, indigestion, abdominal pain, nausea, vomiting, diarrhea,                 change in bowel habits, loss of appetite GU: dysuria, change in color of urine, no urgency or frequency.   flank pain. MS:   joint pain, stiffness, decreased range of motion, back pain. Neuro-     nothing unusual Psych:  change in mood or affect.  depression or anxiety.   memory loss.  OBJ- Physical Exam General- Alert, Oriented, Affect-appropriate, Distress- none acute Skin- rash-none, lesions- none, excoriation- none Lymphadenopathy- none Head- atraumatic, +small mandible            Eyes- Gross vision intact, PERRLA, conjunctivae and secretions clear            Ears- Hearing, canals-normal            Nose- Clear, no-Septal dev, mucus, polyps, erosion, perforation             Throat- Mallampati III-IV , mucosa clear , drainage- none, tonsils+present, +teeth Neck- flexible , trachea midline, no stridor , thyroid nl, carotid no bruit Chest - symmetrical excursion , unlabored           Heart/CV- RRR , no murmur , no gallop  , no rub, nl s1 s2                           - JVD- none , edema- none, stasis changes- none, varices- none           Lung- clear to P&A, wheeze- none, cough- none , dullness-none, rub- none           Chest wall-  Abd-  Br/ Gen/ Rectal- Not done, not indicated Extrem- cyanosis- none, clubbing, none, atrophy- none, strength- nl Neuro- grossly intact to observation

## 2022-03-19 ENCOUNTER — Encounter: Payer: Self-pay | Admitting: Internal Medicine

## 2022-03-19 ENCOUNTER — Ambulatory Visit (INDEPENDENT_AMBULATORY_CARE_PROVIDER_SITE_OTHER): Payer: 59 | Admitting: Internal Medicine

## 2022-03-19 VITALS — BP 124/82 | HR 87 | Ht 72.0 in | Wt 228.0 lb

## 2022-03-19 DIAGNOSIS — G4733 Obstructive sleep apnea (adult) (pediatric): Secondary | ICD-10-CM

## 2022-03-19 DIAGNOSIS — F17200 Nicotine dependence, unspecified, uncomplicated: Secondary | ICD-10-CM | POA: Diagnosis not present

## 2022-03-19 NOTE — Assessment & Plan Note (Signed)
He admits to smoking 3 cigarettes/day.  He is strongly advised to completely stop use of tobacco products.

## 2022-03-19 NOTE — Assessment & Plan Note (Signed)
High probability that he still has medically significant sleep apnea.  Appropriate educational discussion done and questions answered.  I do not think he should need to delay necessary knee surgery for an updated sleep study. Plan-he is cleared to proceed from a pulmonary standpoint with knee surgery.  Anesthesia should have him wear a BiPAP machine while in recovery-suggest 14/ 8, PS 2. We will get him scheduled for an updated sleep study, likely after knee surgery. Then we can look at long-term management options.

## 2022-03-19 NOTE — Patient Instructions (Signed)
Order- schedule Home sleep test   dx OSA  I will recommend to Dr Mayer Camel that you can go ahead with knee surgery as planned. They should have anesthesia put you on a BIPAP machine set 14/ 8, PS 2, while you are in recovery.  Please call if we can help

## 2022-04-10 ENCOUNTER — Telehealth: Payer: Self-pay | Admitting: Family

## 2022-04-10 NOTE — Telephone Encounter (Signed)
Medication: amphetamine-dextroamphetamine (ADDERALL) 20 MG tablet   Has the patient contacted their pharmacy? No.  Preferred Pharmacy: [id number] - [place], [place] - [address]   [address], [place] [address]  Phone:  [phone]  Fax:  [phone]  

## 2022-04-11 MED ORDER — AMPHETAMINE-DEXTROAMPHETAMINE 20 MG PO TABS: 20.0000 mg | ORAL_TABLET | Freq: Two times a day (BID) | ORAL | 0 refills | Status: DC

## 2022-05-09 ENCOUNTER — Telehealth: Payer: Self-pay | Admitting: Family

## 2022-05-09 NOTE — Telephone Encounter (Signed)
Prescription Request  05/09/2022  Is this a "Controlled Substance" medicine? Yes  LOV: 03/09/2022  What is the name of the medication or equipment?  amphetamine-dextroamphetamine (ADDERALL) 20 MG tablet   Have you contacted your pharmacy to request a refill? No   Which pharmacy would you like this sent to?  [facility]     Patient notified that their request is being sent to the clinical staff for review and that they should receive a response within 2 business days.   Please advise at Mobile [phone] (mobile)

## 2022-05-10 MED ORDER — AMPHETAMINE-DEXTROAMPHETAMINE 20 MG PO TABS: 20.0000 mg | ORAL_TABLET | Freq: Two times a day (BID) | ORAL | 0 refills | Status: DC

## 2022-05-10 NOTE — Addendum Note (Signed)
Addended by: Sandford Craze on: 05/10/2022 12:11 PM   Modules accepted: Orders

## 2022-05-14 ENCOUNTER — Encounter: Payer: Self-pay | Admitting: *Deleted

## 2022-05-15 ENCOUNTER — Other Ambulatory Visit (HOSPITAL_BASED_OUTPATIENT_CLINIC_OR_DEPARTMENT_OTHER): Payer: Self-pay

## 2022-05-17 ENCOUNTER — Other Ambulatory Visit (HOSPITAL_BASED_OUTPATIENT_CLINIC_OR_DEPARTMENT_OTHER): Payer: Self-pay

## 2022-05-17 MED ORDER — AZITHROMYCIN 250 MG PO TABS
ORAL_TABLET | ORAL | 0 refills | Status: DC
  Filled 2022-05-17: qty 6, 5d supply, fill #0

## 2022-05-17 MED ORDER — IBUPROFEN 800 MG PO TABS
800.0000 mg | ORAL_TABLET | Freq: Three times a day (TID) | ORAL | 0 refills | Status: AC | PRN
  Filled 2022-05-17: qty 24, 8d supply, fill #0

## 2022-05-29 ENCOUNTER — Encounter (INDEPENDENT_AMBULATORY_CARE_PROVIDER_SITE_OTHER): Payer: 59

## 2022-05-29 DIAGNOSIS — G4733 Obstructive sleep apnea (adult) (pediatric): Secondary | ICD-10-CM

## 2022-06-13 ENCOUNTER — Telehealth: Payer: Self-pay | Admitting: Family

## 2022-06-13 NOTE — Telephone Encounter (Signed)
Prescription Request  06/13/2022  Is this a "Controlled Substance" medicine? Yes  LOV: 03/09/2022  What is the name of the medication or equipment?  amphetamine-dextroamphetamine (ADDERALL) 20 MG tablet   Have you contacted your pharmacy to request a refill? No     [facility] [place] - [facility] [address], [address] [place] [address] Phone: [phone]  Fax: [phone]   Patient notified that their request is being sent to the clinical staff for review and that they should receive a response within 2 business days.   Please advise at [facility] [phone]

## 2022-06-14 MED ORDER — AMPHETAMINE-DEXTROAMPHETAMINE 20 MG PO TABS: 20.0000 mg | ORAL_TABLET | Freq: Two times a day (BID) | ORAL | 0 refills | Status: DC

## 2022-06-14 MED ORDER — AMPHETAMINE-DEXTROAMPHETAMINE 20 MG PO TABS
20.0000 mg | ORAL_TABLET | Freq: Two times a day (BID) | ORAL | 0 refills | Status: DC
  Filled 2022-06-14: qty 60, 30d supply, fill #0

## 2022-06-14 NOTE — Telephone Encounter (Signed)
Last RX: 05/10/22 Last OV:03/09/22 Next OV:07/19/22 UDS:01/02/22 CSC:01/02/22

## 2022-06-14 NOTE — Addendum Note (Signed)
Addended by: Sandford Craze on: 06/14/2022 10:14 PM   Modules accepted: Orders

## 2022-06-14 NOTE — Telephone Encounter (Signed)
Can you please cancel Adderall refill at [facility]?  I sent it there first by accident. Tks

## 2022-06-14 NOTE — Addendum Note (Signed)
Addended by: Sandford Craze on: 06/14/2022 10:17 PM   Modules accepted: Orders

## 2022-06-15 ENCOUNTER — Other Ambulatory Visit (HOSPITAL_BASED_OUTPATIENT_CLINIC_OR_DEPARTMENT_OTHER): Payer: Self-pay

## 2022-06-15 NOTE — Telephone Encounter (Signed)
Rx cancelled.

## 2022-07-18 NOTE — Progress Notes (Deleted)
03/19/22- [name]( 3 cigs/ day) for sleep evaluation courtesy of Daryel Gerald, NP with concern of OSA Medical problem list includes OSA, NASH, ADD, Tobacco Use, Hyperlipidemia, Bee Sting Allergy,  NPSG 11/23/13- AHI 21.5/ hr, CPAP to 12, Body weight 220 lbs -Adderall 20 Epworth score-3 Body weight today-228 lbs Covid vax-no Flu -no -----Pt had sleep study in 2015. States he used cpap machine for 2 months. Would like to explore other options for sleep apnea He is pending surgical knee repair Dr. Gean Birchwood.  He is concerned about timing and his best arrangement may be to have anesthesia put him on a BiPAP machine while in Recovery. He wanted to review options for treating sleep apnea.  We did this.  He is specifically not interested in ENT surgery at this time.  Wife does complain of his loud snoring and has witnessed apneas.  We discussed medical issues associated with untreated sleep apnea and he would be willing to try CPAP again if needed.  He might be a candidate for fitted oral appliance. No history of ENT surgery. We discussed medical problems associated with untreated sleep apnea and emphasized driving responsibility.   07/19/22-  [name]( 3 cigs/ day) followed for OSA, complicated by  NASH, ADD, Tobacco Use, Hyperlipidemia, Bee Sting Allergy,  -Adderall 20 HST 05/10/22- AHI 27.2/ hr, desat to 83%, body weight 225 lbs Body weight today- Needs treatment decision   ROS-see HPI   + = positive Constitutional:    weight loss, night sweats, fevers, chills, fatigue, lassitude. HEENT:    headaches, difficulty swallowing, tooth/dental problems, sore throat,       sneezing, itching, ear ache, nasal congestion, post nasal drip, snoring CV:    chest pain, orthopnea, PND, swelling in lower extremities, anasarca,                                   dizziness, palpitations Resp:   shortness of breath with exertion or at rest.                productive cough,   non-productive cough,  coughing up of blood.              change in color of mucus.  wheezing.   Skin:    rash or lesions. GI:  No-   heartburn, indigestion, abdominal pain, nausea, vomiting, diarrhea,                 change in bowel habits, loss of appetite GU: dysuria, change in color of urine, no urgency or frequency.   flank pain. MS:   joint pain, stiffness, decreased range of motion, back pain. Neuro-     nothing unusual Psych:  change in mood or affect.  depression or anxiety.   memory loss.  OBJ- Physical Exam General- Alert, Oriented, Affect-appropriate, Distress- none acute Skin- rash-none, lesions- none, excoriation- none Lymphadenopathy- none Head- atraumatic, +small mandible            Eyes- Gross vision intact, PERRLA, conjunctivae and secretions clear            Ears- Hearing, canals-normal            Nose- Clear, no-Septal dev, mucus, polyps, erosion, perforation             Throat- Mallampati III-IV , mucosa clear , drainage- none, tonsils+present, +teeth Neck- flexible , trachea midline, no stridor , thyroid nl,  carotid no bruit Chest - symmetrical excursion , unlabored           Heart/CV- RRR , no murmur , no gallop  , no rub, nl s1 s2                           - JVD- none , edema- none, stasis changes- none, varices- none           Lung- clear to P&A, wheeze- none, cough- none , dullness-none, rub- none           Chest wall-  Abd-  Br/ Gen/ Rectal- Not done, not indicated Extrem- cyanosis- none, clubbing, none, atrophy- none, strength- nl Neuro- grossly intact to observation

## 2022-07-19 ENCOUNTER — Ambulatory Visit: Payer: 59 | Admitting: Internal Medicine

## 2022-07-20 ENCOUNTER — Other Ambulatory Visit (HOSPITAL_BASED_OUTPATIENT_CLINIC_OR_DEPARTMENT_OTHER): Payer: Self-pay

## 2022-07-20 ENCOUNTER — Telehealth: Payer: Self-pay | Admitting: Family

## 2022-07-20 MED ORDER — AMPHETAMINE-DEXTROAMPHETAMINE 20 MG PO TABS
20.0000 mg | ORAL_TABLET | Freq: Two times a day (BID) | ORAL | 0 refills | Status: DC
  Filled 2022-07-20: qty 60, 30d supply, fill #0

## 2022-07-20 NOTE — Addendum Note (Signed)
Addended by: Sandford Craze on: 07/20/2022 02:43 PM   Modules accepted: Orders

## 2022-07-20 NOTE — Telephone Encounter (Signed)
Medication: amphetamine-dextroamphetamine (ADDERALL) 20 MG tablet  Has the patient contacted their pharmacy? No.   Preferred Pharmacy:  [facility] [place] - [facility] [address] [place] [address] Phone: [phone]  Fax: [phone]

## 2022-07-20 NOTE — Telephone Encounter (Signed)
Last RX: 05/10/22 Last OV:03/09/22 Next OV:07/19/22 UDS:01/02/22 CSC:01/02/22  

## 2022-08-27 ENCOUNTER — Telehealth: Payer: Self-pay | Admitting: Family

## 2022-08-27 NOTE — Telephone Encounter (Signed)
Last RX: 05/10/22 Last OV:03/09/22 Next OV:no future appointments UDS:01/02/22 CSC:01/02/22

## 2022-08-27 NOTE — Telephone Encounter (Signed)
Prescription Request  08/27/2022  Is this a "Controlled Substance" medicine? Yes  LOV: 03/09/2022  What is the name of the medication or equipment? amphetamine-dextroamphetamine (ADDERALL) 20 MG tablet   Have you contacted your pharmacy to request a refill? No   Which pharmacy would you like this sent to?   [facility] [place] - [facility] [address] [place] [address] Phone: [phone] Fax: [phone]    Patient notified that their request is being sent to the clinical staff for review and that they should receive a response within 2 business days.   Please advise at Mobile [phone] (mobile)

## 2022-08-28 MED ORDER — AMPHETAMINE-DEXTROAMPHETAMINE 20 MG PO TABS: 20.0000 mg | ORAL_TABLET | Freq: Two times a day (BID) | ORAL | 0 refills | Status: DC

## 2022-08-28 NOTE — Addendum Note (Signed)
Addended by: Sandford Craze on: 08/28/2022 07:12 AM   Modules accepted: Orders

## 2022-08-28 NOTE — Telephone Encounter (Signed)
Please contact pt for follow up visit for ADHD.

## 2022-08-29 NOTE — Telephone Encounter (Signed)
Called patient but no answer , left detail message for patient to call for appointment

## 2022-09-25 ENCOUNTER — Telehealth: Payer: Self-pay | Admitting: Family

## 2022-09-25 MED ORDER — AMPHETAMINE-DEXTROAMPHETAMINE 20 MG PO TABS
20.0000 mg | ORAL_TABLET | Freq: Two times a day (BID) | ORAL | 0 refills | Status: DC
  Filled 2022-09-25: qty 60, 30d supply, fill #0

## 2022-09-25 NOTE — Telephone Encounter (Signed)
Pt requesting refill on his amphetamine-dextroamphetamine. He has appt scheduled for 09/28/22. Please send refill to [facility]

## 2022-09-25 NOTE — Addendum Note (Signed)
Addended by: Sandford Craze on: 09/25/2022 06:29 PM   Modules accepted: Orders

## 2022-09-26 ENCOUNTER — Other Ambulatory Visit (HOSPITAL_BASED_OUTPATIENT_CLINIC_OR_DEPARTMENT_OTHER): Payer: Self-pay

## 2022-09-28 ENCOUNTER — Encounter: Payer: Self-pay | Admitting: Family

## 2022-09-28 ENCOUNTER — Ambulatory Visit (INDEPENDENT_AMBULATORY_CARE_PROVIDER_SITE_OTHER): Payer: 59 | Admitting: Family

## 2022-09-28 ENCOUNTER — Other Ambulatory Visit (HOSPITAL_BASED_OUTPATIENT_CLINIC_OR_DEPARTMENT_OTHER): Payer: Self-pay

## 2022-09-28 ENCOUNTER — Telehealth: Payer: Self-pay | Admitting: Family

## 2022-09-28 VITALS — BP 126/87 | HR 88 | Temp 97.5°F | Resp 16 | Ht 72.0 in | Wt 225.4 lb

## 2022-09-28 DIAGNOSIS — F17200 Nicotine dependence, unspecified, uncomplicated: Secondary | ICD-10-CM | POA: Diagnosis not present

## 2022-09-28 DIAGNOSIS — F988 Other specified behavioral and emotional disorders with onset usually occurring in childhood and adolescence: Secondary | ICD-10-CM | POA: Diagnosis not present

## 2022-09-28 DIAGNOSIS — Z91030 Bee allergy status: Secondary | ICD-10-CM

## 2022-09-28 MED ORDER — EPINEPHRINE 0.3 MG/0.3ML IJ SOAJ
0.3000 mg | Freq: Once | INTRAMUSCULAR | 0 refills | Status: AC
Start: 2022-09-28 — End: 2022-09-28
  Filled 2022-09-28: qty 2, 2d supply, fill #0

## 2022-09-28 NOTE — Assessment & Plan Note (Signed)
Patient quit smoking "cold [place]" one week ago.  He is motivated to remain smoke free.  I commended him on this.

## 2022-09-28 NOTE — Assessment & Plan Note (Signed)
Stable and well controlled on Adderall xr. Continue same.  Controlled substance contract is updated and UDS will be obtained.

## 2022-09-28 NOTE — Patient Instructions (Signed)
VISIT SUMMARY:  During your recent visit, we discussed your recent decision to quit smoking, your ADHD management, and your slightly elevated blood pressure readings at home. We also discussed the need for an annual physical due to a change in your insurance.  YOUR PLAN:  -TOBACCO USE DISORDER: You've recently quit smoking and are experiencing cravings, but you're managing well by avoiding triggers and staying busy. I encourage you to continue with these strategies.  -ADHD: Your ADHD is well-managed with Adderall, which helps you focus without any side effects. Please continue taking Adderall as prescribed.  -GENERAL HEALTH MAINTENANCE: Due to your insurance change, you'll need to schedule an annual physical exam in 3 months.

## 2022-09-28 NOTE — Progress Notes (Signed)
Subjective:     Patient ID: Joel [name], male    DOB: 08/29/1985, 37 y.o.   MRN: [id number]  Chief Complaint  Patient presents with   Medication Refill    Medication refill    Medication Refill    Discussed the use of AI scribe software for clinical note transcription with the patient, who gave verbal consent to proceed.  History of Present Illness    The patient, with a history of ADHD and hypertension, presents for a routine follow-up. He has recently quit smoking, a promise made to his children before the start of the school year. Despite cravings, he is managing well and is avoiding friends who smoke. He has previously quit 'cold [place]' for a year after the birth of his first child, but resumed smoking after a stressful event. He is motivated to remain smoke-free for his children and his health as he approaches his 20s.  The patient's ADHD is well-managed on Adderall, with good focus and no side effects. He is eating and sleeping well.  He has reduced his coffee intake since quitting smoking.  The patient also mentions a change in insurance and the need for an annual physical to avoid an increase in premiums.  Health Maintenance Due  Topic Date Due   INFLUENZA VACCINE  08/30/2022    Past Medical History:  Diagnosis Date   ADD (attention deficit disorder)    Hyperlipemia    Obstructive sleep apnea 09/10/2013   OSA per split night study 10/15, settings 12 cm h20 full face mask.      Tobacco abuse     Past Surgical History:  Procedure Laterality Date   KNEE SURGERY Right 12/29/13   NO PAST SURGERIES     denies surgical history   WISDOM TOOTH EXTRACTION  01/18/2016    Family History  Problem Relation Age of Onset   Hypertension Mother    Healthy Father    Healthy Brother    CAD Paternal Grandmother        died at 28   Sudden death Neg Hx    Hyperlipidemia Neg Hx    Heart attack Neg Hx    Diabetes Neg Hx     Social History   Socioeconomic History    Marital status: Married    Spouse name: Not on file   Number of children: Not on file   Years of education: Not on file   Highest education level: Not on file  Occupational History   Not on file  Tobacco Use   Smoking status: Former    Current packs/day: 0.00    Types: Cigarettes    Quit date: 2000    Years since quitting: 24.6    Passive exposure: Current   Smokeless tobacco: Never   Tobacco comments:    3 cigarettes per day 03/19/2022 PAP  Substance and Sexual Activity   Alcohol use: Yes    Comment: case a beer monthly    Drug use: No    Frequency: 7.0 times per week   Sexual activity: Yes    Partners: Female  Other Topics Concern   Not on file  Social History Narrative   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   + tobacco abuse   2 children- daughter age 672 and daughter age 67   Married   [place] moved here at age 63   Completed high school   Enjoys outdoor activities   Working on IT sales professional  Social Determinants of Health   Financial Resource Strain: Not on file  Food Insecurity: Not on file  Transportation Needs: Not on file  Physical Activity: Not on file  Stress: Not on file  Social Connections: Unknown (01/02/2022)   Received from [facility]   Social Network    Social Network: Not on file  Intimate Partner Violence: Unknown (01/02/2022)   Received from [facility]   HITS    Physically Hurt: Not on file    Insult or Talk Down To: Not on file    Threaten Physical Harm: Not on file    Scream or Curse: Not on file    Outpatient Medications Prior to Visit  Medication Sig Dispense Refill   amphetamine-dextroamphetamine (ADDERALL) 20 MG tablet Take 1 tablet (20 mg total) by mouth 2 (two) times daily. 60 tablet 0   ibuprofen (ADVIL) 800 MG tablet Take 1 tablet (800 mg total) by mouth every 8 (eight) hours as needed for discomfort. 24 tablet 0   azithromycin (ZITHROMAX) 250 MG tablet Take 2 tablets (500 mg total) by  mouth on day 1, and then take 1 tablet (250 mg total) daily on days 2 through 5. 6 each 0   EPINEPHrine 0.3 mg/0.3 mL IJ SOAJ injection Inject 0.3 mLs (0.3 mg total) into the muscle once. 2 Device 1   No facility-administered medications prior to visit.    Allergies  Allergen Reactions   Bee Venom Shortness Of Breath    Yellow Jacket   Penicillins     Mother allergic to it. Pt never taken it.    ROS See HPI    Objective:    Physical Exam Constitutional:      General: He is not in acute distress.    Appearance: He is well-developed.  HENT:     Head: Normocephalic and atraumatic.     Right Ear: Tympanic membrane and ear canal normal.     Left Ear: Tympanic membrane and ear canal normal.  Cardiovascular:     Rate and Rhythm: Normal rate and regular rhythm.     Heart sounds: No murmur heard. Pulmonary:     Effort: Pulmonary effort is normal.     Breath sounds: No rales.  Skin:    General: Skin is warm and dry.  Neurological:     Mental Status: He is alert and oriented to person, place, and time.  Psychiatric:        Behavior: Behavior normal.        Thought Content: Thought content normal.      BP 126/87 (BP Location: Right Arm, Patient Position: Sitting, Cuff Size: Normal)   Pulse 88   Temp (!) 97.5 F (36.4 C) (Oral)   Resp 16   Ht 6' (1.829 m)   Wt 225 lb 6.4 oz (102.2 kg)   SpO2 100%   BMI 30.57 kg/m  Wt Readings from Last 3 Encounters:  09/28/22 225 lb 6.4 oz (102.2 kg)  03/19/22 228 lb (103.4 kg)  03/09/22 229 lb (103.9 kg)       Assessment & Plan:   Problem List Items Addressed This Visit       Unprioritized   Tobacco use disorder    Patient quit smoking "cold [place]" one week ago.  He is motivated to remain smoke free.  I commended him on this.       Allergy to bee sting    Will update epipen rx.       Relevant Medications  EPINEPHrine 0.3 mg/0.3 mL IJ SOAJ injection   ADD (attention deficit disorder) - Primary    Stable and well  controlled on Adderall xr. Continue same.  Controlled substance contract is updated and UDS will be obtained.       Relevant Orders   DRUG MONITORING, PANEL 8 WITH CONFIRMATION, URINE    I have discontinued Joel [name] "Joel [name]"'s azithromycin. I have also changed his EPINEPHrine. Additionally, I am having him maintain his ibuprofen and amphetamine-dextroamphetamine.  Meds ordered this encounter  Medications   EPINEPHrine 0.3 mg/0.3 mL IJ SOAJ injection    Sig: Inject 0.3 mg into the muscle once for 1 dose.    Dispense:  2 each    Refill:  0    Order Specific Question:   Supervising Provider    Answer:   Danise Edge A [id number]

## 2022-09-28 NOTE — Assessment & Plan Note (Signed)
Will update epipen rx.

## 2022-09-28 NOTE — Telephone Encounter (Signed)
See mychart.  

## 2022-09-30 LAB — DRUG MONITORING, PANEL 8 WITH CONFIRMATION, URINE
6 Acetylmorphine: NEGATIVE ng/mL (ref <10)
Alcohol Metabolites: NEGATIVE ng/mL (ref <500)
Amphetamine: >15000 ng/mL — ABNORMAL HIGH (ref <250)
Amphetamines: POSITIVE ng/mL — AB (ref <500)
Benzodiazepines: NEGATIVE ng/mL (ref <100)
Buprenorphine, Urine: NEGATIVE ng/mL (ref <5)
Cocaine Metabolite: NEGATIVE ng/mL (ref <150)
Creatinine: 213.1 mg/dL (ref >=20.0)
MDMA: NEGATIVE ng/mL (ref <500)
Marijuana Metabolite: NEGATIVE ng/mL (ref <20)
Methamphetamine: NEGATIVE ng/mL (ref <250)
Opiates: NEGATIVE ng/mL (ref <100)
Oxidant: NEGATIVE ug/mL (ref <200)
Oxycodone: NEGATIVE ng/mL (ref <100)
pH: 6 (ref 4.5–9.0)

## 2022-09-30 LAB — DM TEMPLATE

## 2022-10-16 ENCOUNTER — Other Ambulatory Visit (HOSPITAL_BASED_OUTPATIENT_CLINIC_OR_DEPARTMENT_OTHER): Payer: Self-pay

## 2022-10-29 ENCOUNTER — Telehealth: Payer: Self-pay | Admitting: Family

## 2022-10-29 NOTE — Telephone Encounter (Signed)
Prescription Request  10/29/2022  Is this a "Controlled Substance" medicine? Yes  LOV: 09/28/2022  What is the name of the medication or equipment?   amphetamine-dextroamphetamine (ADDERALL) 20 MG tablet  Have you contacted your pharmacy to request a refill? No   Which pharmacy would you like this sent to?   [facility] [place] - [facility] [address] [place] [address] Phone: [phone] Fax: [phone]    Patient notified that their request is being sent to the clinical staff for review and that they should receive a response within 2 business days.   Please advise at [facility] [phone]

## 2022-10-30 ENCOUNTER — Other Ambulatory Visit (HOSPITAL_BASED_OUTPATIENT_CLINIC_OR_DEPARTMENT_OTHER): Payer: Self-pay

## 2022-10-30 MED ORDER — AMPHETAMINE-DEXTROAMPHETAMINE 20 MG PO TABS
20.0000 mg | ORAL_TABLET | Freq: Two times a day (BID) | ORAL | 0 refills | Status: DC
  Filled 2022-10-30: qty 60, 30d supply, fill #0

## 2022-10-30 NOTE — Addendum Note (Signed)
Addended by: Sandford Craze on: 10/30/2022 05:41 PM   Modules accepted: Orders

## 2022-11-29 ENCOUNTER — Other Ambulatory Visit: Payer: Self-pay | Admitting: Family

## 2022-11-29 NOTE — Telephone Encounter (Signed)
Prescription Request  11/29/2022  Is this a "Controlled Substance" medicine? No  LOV: 09/28/2022  What is the name of the medication or equipment?amphetamine-dextroamphetamine (ADDERALL) 20 MG tablet   Have you contacted your pharmacy to request a refill? No   Which pharmacy would you like this sent to?   [facility] [place] - [facility] [address] [place] [address] Phone: [phone] Fax: [phone]    Patient notified that their request is being sent to the clinical staff for review and that they should receive a response within 2 business days.   Please advise at [facility] [phone]

## 2022-11-29 NOTE — Addendum Note (Signed)
Addended byConrad  D on: 11/29/2022 01:03 PM   Modules accepted: Orders

## 2022-11-29 NOTE — Telephone Encounter (Signed)
Requesting: Adderall 20mg  Contract: 09/28/22 UDS: 09/28/22 Last Visit: 09/28/22 Next Visit: None Last Refill: 10/30/22 #60 and 0RF   Please Advise

## 2022-11-30 ENCOUNTER — Other Ambulatory Visit (HOSPITAL_BASED_OUTPATIENT_CLINIC_OR_DEPARTMENT_OTHER): Payer: Self-pay

## 2022-11-30 MED ORDER — AMPHETAMINE-DEXTROAMPHETAMINE 20 MG PO TABS
20.0000 mg | ORAL_TABLET | Freq: Two times a day (BID) | ORAL | 0 refills | Status: DC
  Filled 2022-11-30: qty 60, 30d supply, fill #0

## 2022-11-30 NOTE — Addendum Note (Signed)
Addended by: Sandford Craze on: 11/30/2022 09:12 AM   Modules accepted: Orders

## 2022-12-31 ENCOUNTER — Telehealth: Payer: Self-pay | Admitting: Family

## 2022-12-31 NOTE — Telephone Encounter (Signed)
Patient called and would like a med refill on  amphetamine-dextroamphetamine (ADDERALL) 20 MG tablet  [facility].

## 2023-01-01 MED ORDER — AMPHETAMINE-DEXTROAMPHETAMINE 20 MG PO TABS: 20.0000 mg | ORAL_TABLET | Freq: Two times a day (BID) | ORAL | 0 refills | Status: DC

## 2023-01-01 NOTE — Addendum Note (Signed)
Addended by: Sandford Craze on: 01/01/2023 05:39 PM   Modules accepted: Orders

## 2023-01-02 ENCOUNTER — Telehealth: Payer: Self-pay | Admitting: Family

## 2023-01-02 NOTE — Telephone Encounter (Signed)
Patient called the amphetamine-dextroamphetamine (ADDERALL) 20 MG tablet  was sent to wrong Pharmacy he wanted it sent to [facility] pharmacy.

## 2023-01-02 NOTE — Telephone Encounter (Signed)
Request sent to provider yesterday

## 2023-01-04 ENCOUNTER — Other Ambulatory Visit: Payer: Self-pay

## 2023-01-04 ENCOUNTER — Other Ambulatory Visit (HOSPITAL_BASED_OUTPATIENT_CLINIC_OR_DEPARTMENT_OTHER): Payer: Self-pay

## 2023-01-04 MED ORDER — AMPHETAMINE-DEXTROAMPHETAMINE 20 MG PO TABS
20.0000 mg | ORAL_TABLET | Freq: Two times a day (BID) | ORAL | 0 refills | Status: DC
  Filled 2023-01-04: qty 60, 30d supply, fill #0

## 2023-01-04 NOTE — Addendum Note (Signed)
Addended by: Sandford Craze on: 01/04/2023 03:19 PM   Modules accepted: Orders

## 2023-01-04 NOTE — Telephone Encounter (Signed)
Patient notified rx was sent yesterday.

## 2023-01-04 NOTE — Telephone Encounter (Signed)
Pt called to follow up on refill request  °

## 2023-01-04 NOTE — Telephone Encounter (Signed)
[facility], [facility] send to [facility]

## 2023-01-04 NOTE — Telephone Encounter (Signed)
Pt called to follow up on whether medication amphetamine-dextroamphetamine (ADDERALL) 20 MG tablet was sent to [facility]. Appears to have been sent to [facility] by mistake. [facility] would like [facility] removed and sent downstairs. Please call pt to advise.

## 2023-02-01 ENCOUNTER — Other Ambulatory Visit: Payer: Self-pay | Admitting: Family

## 2023-02-01 MED ORDER — AMPHETAMINE-DEXTROAMPHETAMINE 20 MG PO TABS: 20.0000 mg | ORAL_TABLET | Freq: Two times a day (BID) | ORAL | 0 refills | Status: DC

## 2023-02-01 NOTE — Telephone Encounter (Signed)
 Requesting:Adderall 20mg   Contract: 09/28/22 UDS: 09/28/22 Last Visit: 09/28/22  Next Visit: None Last Refill: 01/04/23 #60 and 0RF   Please Advise

## 2023-02-01 NOTE — Telephone Encounter (Signed)
 Copied from CRM [id number]. Topic: Clinical - Medication Refill >> Feb 01, 2023 11:30 AM Pinkey ORN wrote: Most Recent Primary Care Visit:  Provider: O'SULLIVAN, MELISSA  Department: [facility]  Visit Type: OFFICE VISIT  Date: 09/28/2022  Medication: amphetamine -dextroamphetamine  (ADDERALL) 20 MG tablet   Has the patient contacted their pharmacy? Yes (Agent: If no, request that the patient contact the pharmacy for the refill. If patient does not wish to contact the pharmacy document the reason why and proceed with request.) (Agent: If yes, when and what did the pharmacy advise?)  Is this the correct pharmacy for this prescription? Yes If no, delete pharmacy and type the correct one.  This is the patient's preferred pharmacy:  [id number] - [place],  - [address] [place]  [address] Phone: [phone] Fax: [phone]  [id number] [place] [facility] - [address] [place] [address] Phone: [phone] Fax: [phone]  [facility] [place] [facility] [address] [place] [address] Phone: [phone] Fax: [phone]   Has the prescription been filled recently? Yes  Is the patient out of the medication? Yes  Has the patient been seen for an appointment in the last year OR does the patient have an upcoming appointment? Yes  Can we respond through MyChart? Yes  Agent: Please be advised that Rx refills may take up to 3 business days. We ask that you follow-up with your pharmacy.

## 2023-02-06 NOTE — Telephone Encounter (Signed)
 Looks like PCP corrected the pharmacy already- unsure if there was an issue on pharmacy or the pt end at this point.      Copied from CRM [id number]. Topic: Clinical - Prescription Issue >> Feb 06, 2023  8:28 AM Deaijah H wrote: Reason for CRM: Patient called in to get update on refill for his prescription amphetamine -dextroamphetamine  (ADDERALL) 20 MG table advised [facility], patient advised that is not the correct pharmacy, correct pharmacy is [facility] [place] [facility] [address], [address] [place] [address] Phone: [phone] Fax: [phone] Hours: M-F 7:30am-6pm

## 2023-02-08 ENCOUNTER — Other Ambulatory Visit (HOSPITAL_BASED_OUTPATIENT_CLINIC_OR_DEPARTMENT_OTHER): Payer: Self-pay

## 2023-02-08 ENCOUNTER — Other Ambulatory Visit: Payer: Self-pay | Admitting: Family

## 2023-02-11 ENCOUNTER — Ambulatory Visit: Payer: Self-pay | Admitting: Family

## 2023-02-11 ENCOUNTER — Other Ambulatory Visit (HOSPITAL_BASED_OUTPATIENT_CLINIC_OR_DEPARTMENT_OTHER): Payer: Self-pay

## 2023-02-11 MED ORDER — AMPHETAMINE-DEXTROAMPHETAMINE 20 MG PO TABS
20.0000 mg | ORAL_TABLET | Freq: Two times a day (BID) | ORAL | 0 refills | Status: DC
  Filled 2023-02-11: qty 60, 30d supply, fill #0

## 2023-02-11 NOTE — Telephone Encounter (Signed)
 Copied from CRM [id number]. Topic: Clinical - Prescription Issue >> Feb 08, 2023 10:45 AM Corin V wrote: Reason for CRM: Patient called regarding his Adderall Rx. Pharmacy needs to be changed to one listed below. Please change and resubmit Rx script. [facility] [place] [facility]  [address] [place] [address]  Phone: [phone] Fax: [phone]  Hours: M-F 7:30am-6pm

## 2023-02-11 NOTE — Telephone Encounter (Signed)
[facility].

## 2023-02-11 NOTE — Telephone Encounter (Signed)
 Copied from CRM [id number]. Topic: Clinical - Medication Question >> Feb 08, 2023  4:11 PM Geneva B wrote: Reason for CRM: patient is calling in because he wants to know why his rx is denied amphetamine-dextroamphetamine (ADDERALL) 20 MG tablet

## 2023-02-11 NOTE — Telephone Encounter (Signed)
 Rx resent to [facility]. Can you please cancel adderall rx at [facility]?

## 2023-02-11 NOTE — Telephone Encounter (Signed)
 Spoke with pt, pt states Rx was sent to the "wrong pharmacy" pt would like Rx sent to pharmacy downstairs ([facility])

## 2023-03-08 ENCOUNTER — Other Ambulatory Visit: Payer: Self-pay | Admitting: Family

## 2023-03-08 NOTE — Telephone Encounter (Signed)
 Requesting: Adderall 20mg   Contract: 09/28/22 UDS: 09/28/22 Last Visit: 09/28/22 Next Visit: None Last Refill: 02/11/23 #60 and 0RF   Please Advise

## 2023-03-08 NOTE — Telephone Encounter (Signed)
 Copied from CRM [id number]. Topic: Clinical - Medication Refill >> Mar 08, 2023 11:26 AM Laymon HERO wrote: Most Recent Primary Care Visit:  Provider: O'SULLIVAN, MELISSA  Department: [facility]  Visit Type: OFFICE VISIT  Date: 09/28/2022  Medication: amphetamine -dextroamphetamine  (ADDERALL) 20 MG tablet   Has the patient contacted their pharmacy? Yes (Agent: If no, request that the patient contact the pharmacy for the refill. If patient does not wish to contact the pharmacy document the reason why and proceed with request.) (Agent: If yes, when and what did the pharmacy advise?)  Is this the correct pharmacy for this prescription? Yes If no, delete pharmacy and type the correct one.  This is the patient's preferred pharmacy:   [place] [facility] [address] [place] [address] Phone: [phone] Fax: [phone]   Has the prescription been filled recently? Yes  Is the patient out of the medication? Yes  Has the patient been seen for an appointment in the last year OR does the patient have an upcoming appointment? Yes  Can we respond through MyChart? Yes  Agent: Please be advised that Rx refills may take up to 3 business days. We ask that you follow-up with your pharmacy.

## 2023-03-09 ENCOUNTER — Other Ambulatory Visit (HOSPITAL_BASED_OUTPATIENT_CLINIC_OR_DEPARTMENT_OTHER): Payer: Self-pay

## 2023-03-09 MED ORDER — AMPHETAMINE-DEXTROAMPHETAMINE 20 MG PO TABS
20.0000 mg | ORAL_TABLET | Freq: Two times a day (BID) | ORAL | 0 refills | Status: DC
  Filled 2023-03-09 – 2023-03-12 (×2): qty 60, 30d supply, fill #0

## 2023-03-11 ENCOUNTER — Other Ambulatory Visit (HOSPITAL_BASED_OUTPATIENT_CLINIC_OR_DEPARTMENT_OTHER): Payer: Self-pay

## 2023-03-12 ENCOUNTER — Other Ambulatory Visit (HOSPITAL_BASED_OUTPATIENT_CLINIC_OR_DEPARTMENT_OTHER): Payer: Self-pay

## 2023-03-12 ENCOUNTER — Telehealth: Payer: Self-pay

## 2023-03-12 MED ORDER — AMPHETAMINE-DEXTROAMPHETAMINE 20 MG PO TABS: 20.0000 mg | ORAL_TABLET | Freq: Two times a day (BID) | ORAL | 0 refills | Status: DC

## 2023-03-12 NOTE — Telephone Encounter (Signed)
Copied from CRM [id number]. Topic: Clinical - Prescription Issue >> Mar 12, 2023  3:33 PM Turkey A wrote: Reason for CRM: Patient called said he went to pharmacy and they did not have correct Adderall he said he had side effects witht he brand they gave him. Patient would like to have amphetamine-dextroamphetamine (ADDERALL) 20 MG tablet; sent to [facility] [address] [place], [place] [address]

## 2023-04-08 ENCOUNTER — Other Ambulatory Visit: Payer: Self-pay | Admitting: Family

## 2023-04-08 MED ORDER — AMPHETAMINE-DEXTROAMPHETAMINE 20 MG PO TABS: 20.0000 mg | ORAL_TABLET | Freq: Two times a day (BID) | ORAL | 0 refills | Status: DC

## 2023-04-08 NOTE — Telephone Encounter (Signed)
 Copied from CRM [id number]. Topic: Clinical - Medication Refill >> Apr 08, 2023 11:38 AM Truddie Crumble wrote: Most Recent Primary Care Visit:  Provider: O'SULLIVAN, MELISSA  Department: [facility]  Visit Type: OFFICE VISIT  Date: 09/28/2022  Medication: amphetamine-dextroamphetamine (ADDERALL) 20 MG tablet  Has the patient contacted their pharmacy? No (Agent: If no, request that the patient contact the pharmacy for the refill. If patient does not wish to contact the pharmacy document the reason why and proceed with request.) (Agent: If yes, when and what did the pharmacy advise?)  Is this the correct pharmacy for this prescription? Yes If no, delete pharmacy and type the correct one.  This is the patient's preferred pharmacy:  [id number] - [place], [place] - [address] [place] [address] Phone: [phone] Fax: [phone]  Has the prescription been filled recently? No  Is the patient out of the medication? Yes  Has the patient been seen for an appointment in the last year OR does the patient have an upcoming appointment? Yes  Can we respond through MyChart? Yes  Agent: Please be advised that Rx refills may take up to 3 business days. We ask that you follow-up with your pharmacy.

## 2023-04-08 NOTE — Telephone Encounter (Unsigned)
 Copied from CRM [id number]. Topic: Clinical - Medication Refill >> Apr 08, 2023 11:38 AM Truddie Crumble wrote: Most Recent Primary Care Visit:  Provider: O'SULLIVAN, MELISSA  Department: [facility]  Visit Type: OFFICE VISIT  Date: 09/28/2022  Medication: amphetamine-dextroamphetamine (ADDERALL) 20 MG tablet  Has the patient contacted their pharmacy? No (Agent: If no, request that the patient contact the pharmacy for the refill. If patient does not wish to contact the pharmacy document the reason why and proceed with request.) (Agent: If yes, when and what did the pharmacy advise?)  Is this the correct pharmacy for this prescription? Yes If no, delete pharmacy and type the correct one.  This is the patient's preferred pharmacy:  [id number] - [place], [place] - [address] [place] [address] Phone: [phone] Fax: [phone]  Has the prescription been filled recently? No  Is the patient out of the medication? Yes  Has the patient been seen for an appointment in the last year OR does the patient have an upcoming appointment? Yes  Can we respond through MyChart? Yes  Agent: Please be advised that Rx refills may take up to 3 business days. We ask that you follow-up with your pharmacy.

## 2023-04-08 NOTE — Telephone Encounter (Signed)
 Requesting: Adderall 20mg  Contract: No UDS: 09/28/22 Last Visit: 09/28/2022 Next Visit: Visit date not found Last Refill: 03/12/23  Please Advise

## 2023-04-16 DIAGNOSIS — H5213 Myopia, bilateral: Secondary | ICD-10-CM | POA: Diagnosis not present

## 2023-04-30 ENCOUNTER — Encounter: Payer: Self-pay | Admitting: *Deleted

## 2023-05-09 ENCOUNTER — Other Ambulatory Visit: Payer: Self-pay | Admitting: Family

## 2023-05-09 MED ORDER — AMPHETAMINE-DEXTROAMPHETAMINE 20 MG PO TABS: 20.0000 mg | ORAL_TABLET | Freq: Two times a day (BID) | ORAL | 0 refills | Status: DC

## 2023-05-09 NOTE — Telephone Encounter (Signed)
 Copied from CRM [id number]. Topic: Clinical - Medication Refill >> May 09, 2023 12:00 PM Corinna Lines S wrote: Most Recent Primary Care Visit:  Provider: O'SULLIVAN, MELISSA  Department: [facility]  Visit Type: OFFICE VISIT  Date: 09/28/2022  Medication: amphetamine-dextroamphetamine (ADDERALL) 20 MG tablet  Has the patient contacted their pharmacy? No (Agent: If no, request that the patient contact the pharmacy for the refill. If patient does not wish to contact the pharmacy document the reason why and proceed with request.) (Agent: If yes, when and what did the pharmacy advise?)  Is this the correct pharmacy for this prescription? Yes If no, delete pharmacy and type the correct one.  This is the patient's preferred pharmacy:  [id number] - [place], [place] - [address] [place] [address] Phone: [phone] Fax: [phone]   Has the prescription been filled recently? No  Is the patient out of the medication? Yes  Has the patient been seen for an appointment in the last year OR does the patient have an upcoming appointment? Yes  Can we respond through MyChart? Yes  Agent: Please be advised that Rx refills may take up to 3 business days. We ask that you follow-up with your pharmacy.

## 2023-05-24 ENCOUNTER — Ambulatory Visit (INDEPENDENT_AMBULATORY_CARE_PROVIDER_SITE_OTHER): Admitting: Family

## 2023-05-24 VITALS — BP 126/78 | HR 69 | Temp 98.9°F | Resp 16 | Ht 72.0 in | Wt 226.0 lb

## 2023-05-24 DIAGNOSIS — F988 Other specified behavioral and emotional disorders with onset usually occurring in childhood and adolescence: Secondary | ICD-10-CM

## 2023-05-24 NOTE — Progress Notes (Signed)
 Subjective:     Patient ID: Joel [name], male    DOB: [birth year], 38 y.o.   MRN: [id number]  Chief Complaint  Patient presents with   ADHD    Here for follow up    HPI  Discussed the use of AI scribe software for clinical note transcription with the patient, who gave verbal consent to proceed.  History of Present Illness  Joel Xiong "[name]" is a 38 year old male with ADHD who presents for a follow-up visit.  He is currently taking Adderall for ADHD, which he finds effective without any new symptoms or side effects. He is satisfied with the treatment, describing it as 'always good'.  He has successfully quit smoking, noting the financial benefits and improved lifestyle.   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 His oldest child assists with babysitting. He has mixed feelings about his role, finding it more challenging than his previous job and sometimes wishing to return to work. His wife works as a Engineer, civil (consulting), contributing to the family income.     There are no preventive care reminders to display for this patient.  Past Medical History:  Diagnosis Date   ADD (attention deficit disorder)    Hyperlipemia    Obstructive sleep apnea 09/10/2013   OSA per split night study 10/15, settings 12 cm h20 full face mask.      Tobacco abuse     Past Surgical History:  Procedure Laterality Date   KNEE SURGERY Right 12/29/13   NO PAST SURGERIES     denies surgical history   WISDOM TOOTH EXTRACTION  01/18/2016    Family History  Problem Relation Age of Onset   Hypertension Mother    Healthy Father    Healthy Brother    CAD Paternal Grandmother        died at 85   Sudden death Neg Hx    Hyperlipidemia Neg Hx    Heart attack Neg Hx    Diabetes Neg Hx     Social History   Socioeconomic History   Marital status: Married    Spouse name: Not on file   Number of children: Not on file   Years of education: Not on file    Highest education level: Not on file  Occupational History   Not on file  Tobacco Use   Smoking status: Former    Current packs/day: 0.00    Types: Cigarettes    Quit date: 2000    Years since quitting: 25.3    Passive exposure: Current   Smokeless tobacco: Never   Tobacco comments:    3 cigarettes per day 03/19/2022 PAP  Substance and Sexual Activity   Alcohol use: Yes    Comment: case a beer monthly    Drug use: No    Frequency: 7.0 times per week   Sexual activity: Yes    Partners: Female  Other Topics Concern   Not on file  Social History Narrative   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   + tobacco abuse   2 children- daughter age 69 and daughter age 37   Married   [place] moved here at age 31   Completed high school   Enjoys outdoor activities   Working on IT sales professional      Social Drivers of Corporate investment banker Strain: Not on [employer] Insecurity: Not on file  Transportation Needs: Not on file  Physical Activity: Not on file  Stress: Not on file  Social Connections: Unknown (01/02/2022)   Received from [facility] [place], [facility]   Social Network    Social Network: Not on file  Intimate Partner Violence: Unknown (01/02/2022)   Received from [facility]   HITS    Physically Hurt: Not on file    Insult or Talk Down To: Not on file    Threaten Physical Harm: Not on file    Scream or Curse: Not on file    Outpatient Medications Prior to Visit  Medication Sig Dispense Refill   amphetamine -dextroamphetamine  (ADDERALL) 20 MG tablet Take 1 tablet (20 mg total) by mouth 2 (two) times daily. 60 tablet 0   ibuprofen  (ADVIL ) 800 MG tablet Take 1 tablet (800 mg total) by mouth every 8 (eight) hours as needed for discomfort. 24 tablet 0   No facility-administered medications prior to visit.    Allergies  Allergen Reactions   Bee Venom Shortness Of Breath    Yellow Jacket   Penicillins     Mother  allergic to it. Pt never taken it.    ROS See HPI    Objective:    Physical Exam Constitutional:      General: He is not in acute distress.    Appearance: He is well-developed.  HENT:     Head: Normocephalic and atraumatic.  Cardiovascular:     Rate and Rhythm: Normal rate and regular rhythm.     Heart sounds: No murmur heard. Pulmonary:     Effort: Pulmonary effort is normal. No respiratory distress.     Breath sounds: Normal breath sounds. No wheezing or rales.  Skin:    General: Skin is warm and dry.  Neurological:     Mental Status: He is alert and oriented to person, place, and time.  Psychiatric:        Behavior: Behavior normal.        Thought Content: Thought content normal.      BP 126/78 (BP Location: Right Arm, Patient Position: Sitting)   Pulse 69   Temp 98.9 F (37.2 C) (Oral)   Resp 16   Ht 6' (1.829 m)   Wt 226 lb (102.5 kg)   SpO2 98%   BMI 30.65 kg/m  Wt Readings from Last 3 Encounters:  05/24/23 226 lb (102.5 kg)  09/28/22 225 lb 6.4 oz (102.2 kg)  03/19/22 228 lb (103.4 kg)       Assessment & Plan:   Problem List Items Addressed This Visit       Unprioritized   ADD (attention deficit disorder) - Primary   Well controlled on adderall. Continue same. Contract updated.         I am having Joel Sefcik "[name]" maintain his ibuprofen  and amphetamine -dextroamphetamine .  No orders of the defined types were placed in this encounter.

## 2023-05-24 NOTE — Assessment & Plan Note (Signed)
 Well controlled on adderall. Continue same. Contract updated.

## 2023-06-12 ENCOUNTER — Other Ambulatory Visit: Payer: Self-pay | Admitting: Family

## 2023-06-12 MED ORDER — AMPHETAMINE-DEXTROAMPHETAMINE 20 MG PO TABS: 20.0000 mg | ORAL_TABLET | Freq: Two times a day (BID) | ORAL | 0 refills | Status: DC

## 2023-06-12 NOTE — Telephone Encounter (Signed)
 Requesting: Adderall 20mg   Contract: 09/28/22 UDS: 09/28/22 Last Visit: 05/24/23 Next Visit: None Last Refill: 05/09/23 #60 and 0RF  Please Advise

## 2023-06-12 NOTE — Telephone Encounter (Signed)
 Copied from CRM [id number]. Topic: Clinical - Medication Refill >> Jun 12, 2023  9:43 AM Bambi Bonine D wrote: Medication: amphetamine -dextroamphetamine  (ADDERALL) 20 MG tablet   Has the patient contacted their pharmacy? Yes (Agent: If no, request that the patient contact the pharmacy for the refill. If patient does not wish to contact the pharmacy document the reason why and proceed with request.) (Agent: If yes, when and what did the pharmacy advise?)  This is the patient's preferred pharmacy:  [id number] - [place], [place] - [address] [place] [address] Phone: [phone] Fax: [phone]  Is this the correct pharmacy for this prescription? Yes If no, delete pharmacy and type the correct one.   Has the prescription been filled recently? No  Is the patient out of the medication? Yes  Has the patient been seen for an appointment in the last year OR does the patient have an upcoming appointment? Yes  Can we respond through MyChart? Yes  Agent: Please be advised that Rx refills may take up to 3 business days. We ask that you follow-up with your pharmacy.

## 2023-07-10 ENCOUNTER — Other Ambulatory Visit: Payer: Self-pay | Admitting: Family

## 2023-07-10 DIAGNOSIS — F988 Other specified behavioral and emotional disorders with onset usually occurring in childhood and adolescence: Secondary | ICD-10-CM

## 2023-07-10 MED ORDER — AMPHETAMINE-DEXTROAMPHETAMINE 20 MG PO TABS: 20.0000 mg | ORAL_TABLET | Freq: Two times a day (BID) | ORAL | 0 refills | Status: DC

## 2023-07-10 NOTE — Telephone Encounter (Signed)
 Copied from CRM [id number]. Topic: Clinical - Medication Refill >> Jul 10, 2023  9:56 AM Vivian Z wrote: Medication: amphetamine -dextroamphetamine  (ADDERALL) 20 MG tablet  Has the patient contacted their pharmacy? Yes (Agent: If no, request that the patient contact the pharmacy for the refill. If patient does not wish to contact the pharmacy document the reason why and proceed with request.) (Agent: If yes, when and what did the pharmacy advise?)  This is the patient's preferred pharmacy:  [id number] - [place], [place] - [address] [place] [address] Phone: [phone] Fax: [phone]  Is this the correct pharmacy for this prescription? Yes If no, delete pharmacy and type the correct one.   Has the prescription been filled recently? No  Is the patient out of the medication? No  Has the patient been seen for an appointment in the last year OR does the patient have an upcoming appointment? Yes  Can we respond through MyChart? Yes  Agent: Please be advised that Rx refills may take up to 3 business days. We ask that you follow-up with your pharmacy.

## 2023-07-10 NOTE — Telephone Encounter (Signed)
 Requesting:  Adderall 20mg   Contract: 06/19/23 UDS: 09/28/22 Last Visit: 05/24/23 Next Visit: None Last Refill: 06/12/23 #60 and 0RF   Please Advise

## 2023-08-08 ENCOUNTER — Other Ambulatory Visit: Payer: Self-pay | Admitting: Family

## 2023-08-08 DIAGNOSIS — F988 Other specified behavioral and emotional disorders with onset usually occurring in childhood and adolescence: Secondary | ICD-10-CM

## 2023-08-08 NOTE — Telephone Encounter (Signed)
 Copied from CRM [id number]. Topic: Clinical - Medication Refill >> Aug 08, 2023  2:54 PM Melissa C wrote: Medication: amphetamine -dextroamphetamine  (ADDERALL) 20 MG tablet  Has the patient contacted their pharmacy? No (Agent: If no, request that the patient contact the pharmacy for the refill. If patient does not wish to contact the pharmacy document the reason why and proceed with request.) (Agent: If yes, when and what did the pharmacy advise?)  This is the patient's preferred pharmacy:  [id number] - [place], [place] - [address] [place] [address] Phone: [phone] Fax: [phone]   Is this the correct pharmacy for this prescription? Yes If no, delete pharmacy and type the correct one.   Has the prescription been filled recently? No  Is the patient out of the medication? No- has 3 days left  Has the patient been seen for an appointment in the last year OR does the patient have an upcoming appointment? Yes  Can we respond through MyChart? Yes  Agent: Please be advised that Rx refills may take up to 3 business days. We ask that you follow-up with your pharmacy.

## 2023-08-08 NOTE — Telephone Encounter (Signed)
 Requesting: Adderall 20mg   Contract: 06/19/22 UDS: 09/28/22 Last Visit: 05/24/23 Next Visit: None Last Refill: 07/10/23 #60 and 0RF   Please Advise

## 2023-08-09 MED ORDER — AMPHETAMINE-DEXTROAMPHETAMINE 20 MG PO TABS: 20.0000 mg | ORAL_TABLET | Freq: Two times a day (BID) | ORAL | 0 refills | Status: DC

## 2023-09-11 ENCOUNTER — Other Ambulatory Visit: Payer: Self-pay | Admitting: Family

## 2023-09-11 DIAGNOSIS — F988 Other specified behavioral and emotional disorders with onset usually occurring in childhood and adolescence: Secondary | ICD-10-CM

## 2023-09-11 MED ORDER — AMPHETAMINE-DEXTROAMPHETAMINE 20 MG PO TABS
20.0000 mg | ORAL_TABLET | Freq: Two times a day (BID) | ORAL | 0 refills | Status: DC
Start: 2023-09-11 — End: 2023-09-13

## 2023-09-11 NOTE — Telephone Encounter (Signed)
 Copied from CRM #[id number]. Topic: Clinical - Medication Refill >> Sep 11, 2023 11:48 AM Thersia BROCKS wrote: Medication: amphetamine -dextroamphetamine  (ADDERALL) 20 MG tablet   Has the patient contacted their pharmacy? Yes (Agent: If no, request that the patient contact the pharmacy for the refill. If patient does not wish to contact the pharmacy document the reason why and proceed with request.) (Agent: If yes, when and what did the pharmacy advise?)  This is the patient's preferred pharmacy:  [id number] - [place], [place] - [address] [place] [address] Phone: [phone] Fax: [phone]   Is this the correct pharmacy for this prescription? Yes If no, delete pharmacy and type the correct one.   Has the prescription been filled recently? No  Is the patient out of the medication? Yes  Has the patient been seen for an appointment in the last year OR does the patient have an upcoming appointment? Yes  Can we respond through MyChart? Yes  Agent: Please be advised that Rx refills may take up to 3 business days. We ask that you follow-up with your pharmacy.

## 2023-09-13 ENCOUNTER — Other Ambulatory Visit: Payer: Self-pay | Admitting: Family

## 2023-09-13 ENCOUNTER — Other Ambulatory Visit (HOSPITAL_BASED_OUTPATIENT_CLINIC_OR_DEPARTMENT_OTHER): Payer: Self-pay

## 2023-09-13 DIAGNOSIS — F988 Other specified behavioral and emotional disorders with onset usually occurring in childhood and adolescence: Secondary | ICD-10-CM

## 2023-09-13 MED ORDER — AMPHETAMINE-DEXTROAMPHETAMINE 20 MG PO TABS
20.0000 mg | ORAL_TABLET | Freq: Two times a day (BID) | ORAL | 0 refills | Status: DC
  Filled 2023-09-13 (×2): qty 60, 30d supply, fill #0

## 2023-09-13 NOTE — Telephone Encounter (Unsigned)
 Copied from CRM #[id number]. Topic: Clinical - Medication Refill >> Sep 13, 2023 12:46 PM Suzen RAMAN wrote: Medication: amphetamine -dextroamphetamine  (ADDERALL) 20 MG tablet  Has the patient contacted their pharmacy? Yes   This is the patient's preferred pharmacy:   [facility] [place] [facility] [address] [place] [address] Phone: [phone] Fax: [phone]  Is this the correct pharmacy for this prescription? Yes If no, delete pharmacy and type the correct one.   Has the prescription been filled recently? Yes- Patient states the other pharmacy did not have medication in stock  Is the patient out of the medication? No  Has the patient been seen for an appointment in the last year OR does the patient have an upcoming appointment? Yes  Can we respond through MyChart? Yes  Agent: Please be advised that Rx refills may take up to 3 business days. We ask that you follow-up with your pharmacy.

## 2023-09-13 NOTE — Telephone Encounter (Signed)
 Do you mind cancelling rx at [facility] please?

## 2023-09-13 NOTE — Telephone Encounter (Signed)
 Wrong pharmacy, can you resend downstairs please?

## 2023-09-23 NOTE — Telephone Encounter (Signed)
 This rx was cancelled

## 2023-10-11 ENCOUNTER — Other Ambulatory Visit (HOSPITAL_BASED_OUTPATIENT_CLINIC_OR_DEPARTMENT_OTHER): Payer: Self-pay

## 2023-10-11 ENCOUNTER — Other Ambulatory Visit: Payer: Self-pay | Admitting: Family

## 2023-10-11 DIAGNOSIS — F988 Other specified behavioral and emotional disorders with onset usually occurring in childhood and adolescence: Secondary | ICD-10-CM

## 2023-10-11 MED ORDER — AMPHETAMINE-DEXTROAMPHETAMINE 20 MG PO TABS
20.0000 mg | ORAL_TABLET | Freq: Two times a day (BID) | ORAL | 0 refills | Status: DC
  Filled 2023-10-11: qty 60, 30d supply, fill #0

## 2023-10-11 NOTE — Telephone Encounter (Signed)
 Requesting: Adderall 20MG   Contract: 06/19/23 UDS: 09/28/22 Last Visit: 05/24/23 Next Visit: None Last Refill:  09/13/23 #60 and 0RF   Please Advise

## 2023-10-11 NOTE — Telephone Encounter (Unsigned)
 Copied from CRM #[id number]. Topic: Clinical - Medication Refill >> Oct 11, 2023  2:54 PM Deleta S wrote: Medication: amphetamine -dextroamphetamine  (ADDERALL) 20 MG tablet Medication   Has the patient contacted their pharmacy? No (Agent: If no, request that the patient contact the pharmacy for the refill. If patient does not wish to contact the pharmacy document the reason why and proceed with request.) (Agent: If yes, when and what did the pharmacy advise?)   [facility] [place] [facility] [address] [place] [address] Phone: [phone] Fax: [phone]  Is this the correct pharmacy for this prescription? yes If no, delete pharmacy and type the correct one.   Has the prescription been filled recently? No  Is the patient out of the medication? No  Has the patient been seen for an appointment in the last year OR does the patient have an upcoming appointment? Yes  Can we respond through MyChart? Yes  Agent: Please be advised that Rx refills may take up to 3 business days. We ask that you follow-up with your pharmacy.

## 2023-10-11 NOTE — Telephone Encounter (Signed)
 Please call pt to schedule a follow up visit in October.

## 2023-10-14 NOTE — Telephone Encounter (Signed)
 Lvm to sched

## 2023-10-15 ENCOUNTER — Other Ambulatory Visit (HOSPITAL_BASED_OUTPATIENT_CLINIC_OR_DEPARTMENT_OTHER): Payer: Self-pay

## 2023-10-16 ENCOUNTER — Telehealth: Payer: Self-pay

## 2023-10-16 DIAGNOSIS — F988 Other specified behavioral and emotional disorders with onset usually occurring in childhood and adolescence: Secondary | ICD-10-CM

## 2023-10-16 MED ORDER — AMPHETAMINE-DEXTROAMPHETAMINE 20 MG PO TABS
20.0000 mg | ORAL_TABLET | Freq: Two times a day (BID) | ORAL | 0 refills | Status: DC
Start: 2023-10-16 — End: 2023-11-11

## 2023-10-16 NOTE — Telephone Encounter (Signed)
 Can you please cancel adderall rx at [facility]? Resent to [facility].

## 2023-10-16 NOTE — Telephone Encounter (Signed)
 Copied from CRM [id number]. Topic: Clinical - Prescription Issue >> Oct 16, 2023 10:05 AM Martinique E wrote: Reason for CRM: Patient has been having a hard time finding a location that has his amphetamine -dextroamphetamine  (ADDERALL) 20 MG tablet in stock. Patient called the [facility] pharmacy at [facility] and they have it in stock, patient questioning if his adderall could be sent to that location instead.

## 2023-10-17 NOTE — Telephone Encounter (Signed)
 Patient picked up rx.

## 2023-11-11 ENCOUNTER — Other Ambulatory Visit: Payer: Self-pay | Admitting: Family

## 2023-11-11 DIAGNOSIS — F988 Other specified behavioral and emotional disorders with onset usually occurring in childhood and adolescence: Secondary | ICD-10-CM

## 2023-11-11 MED ORDER — AMPHETAMINE-DEXTROAMPHETAMINE 20 MG PO TABS: 20.0000 mg | ORAL_TABLET | Freq: Two times a day (BID) | ORAL | 0 refills | Status: DC

## 2023-11-11 NOTE — Telephone Encounter (Signed)
Please contact pt to schedule follow up visit.

## 2023-11-11 NOTE — Telephone Encounter (Unsigned)
 Copied from CRM [id number]. Topic: Clinical - Medication Refill >> Nov 11, 2023  2:14 PM Tiffini S wrote: Medication:  amphetamine -dextroamphetamine  (ADDERALL) 20 MG tablet Has the patient contacted their pharmacy? Yes (Agent: If no, request that the patient contact the pharmacy for the refill. If patient does not wish to contact the pharmacy document the reason why and proceed with request.) (Agent: If yes, when and what did the pharmacy advise?)  This is the patient's preferred pharmacy:   [id number] [place],  - [address] AT [facility] [address] [place] [address] Phone: [phone] Fax: [phone]  Is this the correct pharmacy for this prescription? Yes If no, delete pharmacy and type the correct one.   Has the prescription been filled recently? Yes  Is the patient out of the medication? Yes  Has the patient been seen for an appointment in the last year OR does the patient have an upcoming appointment? Yes  Can we respond through MyChart? Yes  Agent: Please be advised that Rx refills may take up to 3 business days. We ask that you follow-up with your pharmacy.

## 2023-11-11 NOTE — Telephone Encounter (Signed)
 Requesting: Adderall 20mg   Contract:06/19/23 UDS:09/28/22 Last Visit: 05/24/23 Next Visit: None Last Refill: 10/16/23 #60 and 0RF   Please Advise

## 2023-11-11 NOTE — Telephone Encounter (Signed)
I left a voicemail to schedule.

## 2023-11-14 NOTE — Telephone Encounter (Signed)
 Error. RX sent on 11/11/23 already.

## 2023-11-14 NOTE — Telephone Encounter (Signed)
 Copied from CRM #[id number]. Topic: Clinical - Medication Question >> Nov 14, 2023  8:55 AM Viola F wrote: Reason for CRM: Patient called to follow up on refill request for the amphetamine -dextroamphetamine  (ADDERALL) 20 MG tablet from 11/11/23, I scheduled him a follow up for 11/22/23 - patient wants to know if medication can be filled until then?

## 2023-11-20 ENCOUNTER — Other Ambulatory Visit (HOSPITAL_COMMUNITY): Payer: Self-pay

## 2023-11-22 ENCOUNTER — Ambulatory Visit: Admitting: Family

## 2023-11-22 ENCOUNTER — Telehealth: Payer: Self-pay | Admitting: Family

## 2023-11-22 VITALS — BP 119/75 | HR 62 | Temp 97.6°F | Resp 10 | Ht 72.0 in | Wt 228.0 lb

## 2023-11-22 DIAGNOSIS — F988 Other specified behavioral and emotional disorders with onset usually occurring in childhood and adolescence: Secondary | ICD-10-CM | POA: Diagnosis not present

## 2023-11-22 DIAGNOSIS — Z91030 Bee allergy status: Secondary | ICD-10-CM | POA: Diagnosis not present

## 2023-11-22 MED ORDER — EPINEPHRINE 0.3 MG/0.3ML IJ SOAJ: 0.3000 mg | INTRAMUSCULAR | 0 refills | Status: AC | PRN

## 2023-11-22 NOTE — Progress Notes (Signed)
 Subjective:     Patient ID: Joel [name], male    DOB: 23-Feb-1985, 38 y.o.   MRN: [id number]  Chief Complaint  Patient presents with   ADHD    Here for follow up last UDS 09/28/22 and last [facility] 05/24/23    HPI  Discussed the use of AI scribe software for clinical note transcription with the patient, who gave verbal consent to proceed.  History of Present Illness  Joel [name] is a 38 year old male who presents for medication management of ADHD.  He is currently taking Adderall, which lasts through the afternoon without affecting his sleep. He received a refill on November 11, 2023, and is managing well with the current prescription. His sleep is described as 'pretty good', and he has no concerns about his current health status. 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     Health Maintenance Due  Topic Date Due   Hepatitis B Vaccines 19-59 Average Risk (1 of 3 - 19+ 3-dose series) Never done   HPV VACCINES (1 - 3-dose SCDM series) Never done    Past Medical History:  Diagnosis Date   ADD (attention deficit disorder)    Hyperlipemia    Obstructive sleep apnea 09/10/2013   OSA per split night study 10/15, settings 12 cm h20 full face mask.      Tobacco abuse     Past Surgical History:  Procedure Laterality Date   KNEE SURGERY Right 12/29/13   NO PAST SURGERIES     denies surgical history   WISDOM TOOTH EXTRACTION  01/18/2016    Family History  Problem Relation Age of Onset   Hypertension Mother    Healthy Father    Healthy Brother    CAD Paternal Grandmother        died at 55   Sudden death Neg Hx    Hyperlipidemia Neg Hx    Heart attack Neg Hx    Diabetes Neg Hx     Social History   Socioeconomic History   Marital status: Married    Spouse name: Not on file   Number of children: Not on file   Years of education: Not on file   Highest education level: Not on file  Occupational History   Not on file   Tobacco Use   Smoking status: Former    Current packs/day: 0.00    Types: Cigarettes    Quit date: 2000    Years since quitting: 25.8    Passive exposure: Current   Smokeless tobacco: Never   Tobacco comments:    3 cigarettes per day 03/19/2022 PAP  Substance and Sexual Activity   Alcohol use: Yes    Comment: case a beer monthly    Drug use: No    Frequency: 7.0 times per week   Sexual activity: Yes    Partners: Female  Other Topics Concern   Not on file  Social History Narrative   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   + tobacco abuse   2 children- daughter age 243 and daughter age 24   Married   [place] moved here at age 33   Completed high school   Enjoys outdoor activities   Working on IT sales professional      Social Drivers of Corporate investment banker Strain: Not on file  Food Insecurity: Not on file  Transportation Needs: Not on file  Physical Activity: Not on file  Stress: Not on file  Social Connections: Unknown (01/02/2022)   Received from [facility]   Social Network    Social Network: Not on file  Intimate Partner Violence: Unknown (01/02/2022)   Received from [facility]   HITS    Physically Hurt: Not on file    Insult or Talk Down To: Not on file    Threaten Physical Harm: Not on file    Scream or Curse: Not on file    Outpatient Medications Prior to Visit  Medication Sig Dispense Refill   amphetamine -dextroamphetamine  (ADDERALL) 20 MG tablet Take 1 tablet (20 mg total) by mouth 2 (two) times daily. 60 tablet 0   ibuprofen  (ADVIL ) 800 MG tablet Take 1 tablet (800 mg total) by mouth every 8 (eight) hours as needed for discomfort. 24 tablet 0   No facility-administered medications prior to visit.    Allergies  Allergen Reactions   Bee Venom Shortness Of Breath    Yellow Jacket   Penicillins     Mother allergic to it. Pt never taken it.    ROS See HPI    Objective:    Physical Exam Constitutional:       General: He is not in acute distress.    Appearance: He is well-developed.  HENT:     Head: Normocephalic and atraumatic.  Cardiovascular:     Rate and Rhythm: Normal rate and regular rhythm.     Heart sounds: No murmur heard. Pulmonary:     Effort: Pulmonary effort is normal. No respiratory distress.     Breath sounds: Normal breath sounds. No wheezing or rales.  Skin:    General: Skin is warm and dry.  Neurological:     Mental Status: He is alert and oriented to person, place, and time.  Psychiatric:        Behavior: Behavior normal.        Thought Content: Thought content normal.      BP 119/75 (BP Location: Right Arm, Patient Position: Sitting, Cuff Size: Large)   Pulse 62   Temp 97.6 F (36.4 C) (Oral)   Resp 10   Ht 6' (1.829 m)   Wt 228 lb (103.4 kg)   SpO2 99%   BMI 30.92 kg/m  Wt Readings from Last 3 Encounters:  11/22/23 228 lb (103.4 kg)  05/24/23 226 lb (102.5 kg)  09/28/22 225 lb 6.4 oz (102.2 kg)       Assessment & Plan:   Problem List Items Addressed This Visit       Unprioritized   Allergy to bee sting   Update Epipen .      Relevant Medications   EPINEPHrine  0.3 mg/0.3 mL IJ SOAJ injection   ADD (attention deficit disorder) - Primary   Well controlled on adderall xr. Will continue the same. Contract is up to date.        I am having Joel [name] start on EPINEPHrine . I am also having him maintain his ibuprofen  and amphetamine -dextroamphetamine .  Meds ordered this encounter  Medications   EPINEPHrine  0.3 mg/0.3 mL IJ SOAJ injection    Sig: Inject 0.3 mg into the muscle as needed for anaphylaxis.    Dispense:  2 each    Refill:  0    Supervising Provider:   DOMENICA BLACKBIRD A [[id number]]

## 2023-11-22 NOTE — Assessment & Plan Note (Signed)
 Well controlled on adderall xr. Will continue the same. Contract is up to date.

## 2023-11-22 NOTE — Assessment & Plan Note (Signed)
 Update Epipen.

## 2023-11-22 NOTE — Telephone Encounter (Signed)
 See mychart.

## 2023-11-22 NOTE — Patient Instructions (Addendum)
  VISIT SUMMARY:  You came in for a follow-up on your ADHD medication management. Your current prescription of Adderall is working well for you without affecting your sleep, and you have no other health concerns at this time.  YOUR PLAN:  ATTENTION-DEFICIT HYPERACTIVITY DISORDER (ADHD): Your ADHD is well-managed with your current medication, Adderall, which helps you with daily activities and does not disturb your sleep. -Continue taking Adderall as prescribed. -We have documented your medication management and response. -Please notify us  a few days before you run out of medication so we can arrange a refill.

## 2023-12-13 ENCOUNTER — Other Ambulatory Visit: Payer: Self-pay | Admitting: Family

## 2023-12-13 DIAGNOSIS — F988 Other specified behavioral and emotional disorders with onset usually occurring in childhood and adolescence: Secondary | ICD-10-CM

## 2023-12-13 NOTE — Telephone Encounter (Signed)
 Copied from CRM [id number]. Topic: Clinical - Medication Refill >> Dec 13, 2023  9:46 AM Alfonso ORN wrote: Medication: amphetamine -dextroamphetamine  (ADDERALL) 20 MG tablet  Has the patient contacted their pharmacy? Yes   This is the patient's preferred pharmacy:   [id number] [place], [place] - [address] AT [facility] OF [facility] [address] [address] & [address] [place] [address] Phone: [phone] Fax: [phone]  Is this the correct pharmacy for this prescription? Yes If no, delete pharmacy and type the correct one.   Has the prescription been filled recently? Yes  Is the patient out of the medication? No  Has the patient been seen for an appointment in the last year OR does the patient have an upcoming appointment? Yes  Can we respond through MyChart? Yes

## 2023-12-13 NOTE — Telephone Encounter (Signed)
 Requesting: Adderall 20 MG Contract: 06/19/2023 UDS: 09/28/2022 Last Visit: 11/22/2023 Next Visit: 05/22/2024 Last Refill: 11/11/2023  Please Advise

## 2023-12-16 MED ORDER — AMPHETAMINE-DEXTROAMPHETAMINE 20 MG PO TABS
20.0000 mg | ORAL_TABLET | Freq: Two times a day (BID) | ORAL | 0 refills | Status: AC
Start: 2023-12-16 — End: ?

## 2024-01-15 ENCOUNTER — Other Ambulatory Visit: Payer: Self-pay | Admitting: Family

## 2024-01-15 DIAGNOSIS — F988 Other specified behavioral and emotional disorders with onset usually occurring in childhood and adolescence: Secondary | ICD-10-CM

## 2024-01-15 NOTE — Telephone Encounter (Unsigned)
 Copied from CRM #[id number]. Topic: Clinical - Medication Refill >> Jan 15, 2024  3:48 PM Carlyon D wrote: Medication: amphetamine -dextroamphetamine  (ADDERALL) 20 MG tablet  Has the patient contacted their pharmacy? No (Agent: If no, request that the patient contact the pharmacy for the refill. If patient does not wish to contact the pharmacy document the reason why and proceed with request.) (Agent: If yes, when and what did the pharmacy advise?)  This is the patient's preferred pharmacy:   [facility] #[id number] [place], [place] - [address] AT [facility] OF [facility] & [address] [place] [address] Phone: [phone] Fax: [phone]  Is this the correct pharmacy for this prescription? Yes If no, delete pharmacy and type the correct one.   Has the prescription been filled recently? Yes  Is the patient out of the medication? No, 2 days left   Has the patient been seen for an appointment in the last year OR does the patient have an upcoming appointment? Yes  Can we respond through MyChart? No prefers phone call   Agent: Please be advised that Rx refills may take up to 3 business days. We ask that you follow-up with your pharmacy.

## 2024-01-16 MED ORDER — AMPHETAMINE-DEXTROAMPHETAMINE 20 MG PO TABS: 20.0000 mg | ORAL_TABLET | Freq: Two times a day (BID) | ORAL | 0 refills | Status: DC

## 2024-01-16 NOTE — Telephone Encounter (Signed)
 Requesting: Adderall 20mg   Contract:09/28/22 UDS:09/18/22 Last Visit:11/22/23 Next Visit:05/22/24 Last Refill: 12/16/23 #60 and 0RF  Please Advise

## 2024-02-19 ENCOUNTER — Other Ambulatory Visit: Payer: Self-pay

## 2024-02-19 DIAGNOSIS — F988 Other specified behavioral and emotional disorders with onset usually occurring in childhood and adolescence: Secondary | ICD-10-CM

## 2024-02-19 MED ORDER — AMPHETAMINE-DEXTROAMPHETAMINE 20 MG PO TABS: 20.0000 mg | ORAL_TABLET | Freq: Two times a day (BID) | ORAL | 0 refills | Status: AC

## 2024-02-19 NOTE — Telephone Encounter (Signed)
 Requesting: Adderall 20MG   Contract:06/19/23 UDS: 09/28/22 Last Visit: 11/22/23 Next Visit: 05/22/24 Last Refill: 01/16/24 #60 and 0RF   Please Advise

## 2024-02-19 NOTE — Telephone Encounter (Signed)
 Copied from CRM #[id number]. Topic: Clinical - Medication Refill >> Feb 19, 2024 11:25 AM Berneda FALCON wrote: Medication: amphetamine -dextroamphetamine  (ADDERALL)  Has the patient contacted their pharmacy? No, controlled substance (Agent: If no, request that the patient contact the pharmacy for the refill. If patient does not wish to contact the pharmacy document the reason why and proceed with request.) (Agent: If yes, when and what did the pharmacy advise?)  This is the patient's preferred pharmacy:  [facility] #[id number] - [place], [place] - [address] AT [address] [place] [address] Phone: [phone] Fax: [phone]  Is this the correct pharmacy for this prescription? Yes If no, delete pharmacy and type the correct one.   Has the prescription been filled recently? No  Is the patient out of the medication? Yes  Has the patient been seen for an appointment in the last year OR does the patient have an upcoming appointment? Yes  Can we respond through MyChart? Yes  Agent: Please be advised that Rx refills may take up to 3 business days. We ask that you follow-up with your pharmacy.

## 2024-05-22 ENCOUNTER — Encounter: Admitting: Family
